# Patient Record
Sex: Female | Born: 2000
Health system: Southern US, Community
[De-identification: ages and names within clinical notes are randomized; demographics above are authoritative.]

## PROBLEM LIST (undated history)

## (undated) DIAGNOSIS — F419 Anxiety disorder, unspecified: Secondary | ICD-10-CM

## (undated) DIAGNOSIS — R51 Headache: Secondary | ICD-10-CM

## (undated) DIAGNOSIS — J45909 Unspecified asthma, uncomplicated: Secondary | ICD-10-CM

## (undated) HISTORY — DX: Unspecified asthma, uncomplicated: J45.909

## (undated) HISTORY — DX: Headache: R51

## (undated) HISTORY — DX: Anxiety disorder, unspecified: F41.9

---
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
## 2018-04-17 ENCOUNTER — Ambulatory Visit
Admission: RE | Admit: 2018-04-17 | Discharge: 2018-04-17 | Disposition: A | Discharge status: Home / Self Care | Payer: Managed Care, Other (non HMO) | Source: Ambulatory Visit | Attending: Internal Medicine | Admitting: Internal Medicine

## 2018-04-17 ENCOUNTER — Ambulatory Visit (INDEPENDENT_AMBULATORY_CARE_PROVIDER_SITE_OTHER): Payer: Managed Care, Other (non HMO) | Admitting: Internal Medicine

## 2018-04-17 ENCOUNTER — Telehealth: Payer: Self-pay

## 2018-04-17 ENCOUNTER — Other Ambulatory Visit: Payer: Self-pay | Admitting: Internal Medicine

## 2018-04-17 ENCOUNTER — Encounter: Payer: Self-pay | Admitting: Internal Medicine

## 2018-04-17 VITALS — BP 116/78 | HR 112 | Temp 98.5°F | Ht 63.5 in | Wt 130.1 lb

## 2018-04-17 DIAGNOSIS — R6884 Jaw pain: Secondary | ICD-10-CM

## 2018-04-17 DIAGNOSIS — R51 Headache: Secondary | ICD-10-CM

## 2018-04-17 DIAGNOSIS — R519 Headache, unspecified: Secondary | ICD-10-CM

## 2018-04-17 LAB — CBC WITH DIFFERENTIAL/PLATELET
BASOS PCT: 0.5 % (ref 0.0–3.0)
Basophils Absolute: 0.1 10*3/uL (ref 0.0–0.1)
EOS PCT: 0.1 % (ref 0.0–5.0)
Eosinophils Absolute: 0 10*3/uL (ref 0.0–0.7)
HCT: 39.4 % (ref 36.0–49.0)
Hemoglobin: 13.2 g/dL (ref 12.0–16.0)
Lymphocytes Relative: 22.8 % — ABNORMAL LOW (ref 24.0–48.0)
Lymphs Abs: 2.3 10*3/uL (ref 0.7–4.0)
MCHC: 33.6 g/dL (ref 31.0–37.0)
MCV: 92.1 fl (ref 78.0–98.0)
Monocytes Absolute: 0.6 10*3/uL (ref 0.1–1.0)
Monocytes Relative: 6.1 % (ref 3.0–12.0)
Neutro Abs: 7 10*3/uL (ref 1.4–7.7)
Neutrophils Relative %: 70.5 % (ref 43.0–71.0)
PLATELETS: 298 10*3/uL (ref 150.0–575.0)
RBC: 4.28 Mil/uL (ref 3.80–5.70)
RDW: 13.2 % (ref 11.4–15.5)
WBC: 10 10*3/uL (ref 4.5–13.5)

## 2018-04-17 LAB — COMPREHENSIVE METABOLIC PANEL
ALT: 11 U/L (ref 0–35)
AST: 13 U/L (ref 0–37)
Albumin: 4.5 g/dL (ref 3.5–5.2)
Alkaline Phosphatase: 43 U/L — ABNORMAL LOW (ref 47–119)
BUN: 9 mg/dL (ref 6–23)
CO2: 27 mEq/L (ref 19–32)
Calcium: 9.8 mg/dL (ref 8.4–10.5)
Chloride: 101 mEq/L (ref 96–112)
Creatinine, Ser: 1.1 mg/dL (ref 0.40–1.20)
GFR: 82.01 mL/min (ref >60.00)
Glucose, Bld: 88 mg/dL (ref 70–99)
POTASSIUM: 3.8 meq/L (ref 3.5–5.1)
Sodium: 138 mEq/L (ref 135–145)
Total Bilirubin: 0.5 mg/dL (ref 0.2–1.2)
Total Protein: 7.9 g/dL (ref 6.0–8.3)

## 2018-04-17 LAB — SEDIMENTATION RATE: Sed Rate: 18 mm/hr (ref 0–20)

## 2018-04-17 LAB — POCT URINE PREGNANCY: Preg Test, Ur: NEGATIVE

## 2018-04-17 MED ORDER — HYDROCODONE-ACETAMINOPHEN 10-325 MG PO TABS: 1.0000 | ORAL_TABLET | Freq: Four times a day (QID) | ORAL | 0 refills | Status: DC | PRN

## 2018-04-17 MED ORDER — METHYLPREDNISOLONE ACETATE 80 MG/ML IJ SUSP
80.0000 mg | Freq: Once | INTRAMUSCULAR | Status: AC
  Administered 2018-04-17: 80 mg via INTRAMUSCULAR

## 2018-04-17 MED ORDER — PREDNISONE 10 MG PO TABS: ORAL_TABLET | ORAL | 0 refills | Status: DC

## 2018-04-17 MED ORDER — ACYCLOVIR 400 MG PO TABS: 400.0000 mg | ORAL_TABLET | Freq: Every day | ORAL | 0 refills | Status: DC

## 2018-04-17 NOTE — Patient Instructions (Addendum)
[facility] after you get your labs done here  I have ordered x rays of your jaw  I am treating you with steroids and an antiviral in case this is  Vasculitis or an unusual case of shingles  Start the prednisone tomorrow and the acyclovir today.  vicodin as needed for pain   If the xrays suggest an abscess,  I will let you know and change your medicine to an antibiotic

## 2018-04-17 NOTE — Telephone Encounter (Signed)
Copied from CRM [phone]. Topic: General - Inquiry >> Apr 17, 2018  2:29 PM Windy KalataMichael, Taylor L, NT wrote: Reason for CRM:  Herbert SetaHeather is calling from [facility] and states the patient is there to get a Orthopantomogram but they do not do those there, she is wanting to know if Dr. Darrick Huntsmanullo wants a Mandible which is plain film or  CT maxillofacial. Please contact at [phone]

## 2018-04-17 NOTE — Progress Notes (Signed)
Subjective:  Patient ID: Lisa Mitchell, female    DOB: Dec 16, 2000  Age: 17 y.o. MRN: [id number]  CC: The primary encounter diagnosis was Left-sided headache. A diagnosis of Pain in upper jaw was also pertinent to this visit.  HPI Lisa Mitchell presents for urgent evaluation of  Severe left sided facial pain .  Patient is accompanied by her mother,  The painbegan  4 days ago,  after her Thanksgiving dinner, and started at the left TMJ.  It is described as severe,  Lancinating,  And radiates to the ear,  left temple  And the molars  on the left upper maxilla.  Denies fevers,  Nausea,  No recent URI  No trouble swallowing.  Not particularly aggravated by hot or cold foods,  But chewing makes the pain worse , as well as opening the mouth.  Tried using a heating pad,  Made it worse.  Even cold air makes the pain worse.   When the pain is present it hurts to open her left eye fully so she feels her vision is blurry on that side but she is usually squinting at that point.    Started taking prednisone  Tapering dose  60 mg on day 1,  Then 50 mg day 2 ,  But after the 39m on day 3  Pain started getting worse again so she stopped taking it.  (last dose yesterday. Then she took her sister's augmentin 2 doses yesterday,  No change .  Has not had dental evaluation oin several years,  Back teeth always hurt,  Gums hurt and bleed after brushing   Outpatient Medications Prior to Visit  Medication Sig Dispense Refill  . Norethin-Eth Estradiol-Fe [facility] 0.4-35 MG-MCG tablet Chew 1 tablet by mouth daily. 1 Package 11  . erythromycin ophthalmic ointment Place 1 application into the left eye 4 (four) times daily. (Patient not taking: Reported on 04/17/2018) 3.5 g 0  . esomeprazole (NEXIUM) 20 MG capsule Take 2 capsules (40 mg total) by mouth daily. (Patient not taking: Reported on 04/17/2018) 30 capsule 0  . ketoconazole (NIZORAL) 2 % cream Apply 1 application topically 2 (two) times daily. (Patient not taking:  Reported on 04/17/2018) 15 g 1   No facility-administered medications prior to visit.     Review of Systems;  Patient denies headache, fevers, malaise, unintentional weight loss, skin rash, eye pain, sinus congestion and sinus pain, sore throat, dysphagia,  hemoptysis , cough, dyspnea, wheezing, chest pain, palpitations, orthopnea, edema, abdominal pain, nausea, melena, diarrhea, constipation, flank pain, dysuria, hematuria, urinary  Frequency, nocturia, numbness, tingling, seizures,  Focal weakness, Loss of consciousness,  Tremor, insomnia, depression, anxiety, and suicidal ideation.      Objective:  BP 116/78 (BP Location: Right Arm, Patient Position: Sitting, Cuff Size: Normal)   Pulse (!) 112   Temp 98.5 F (36.9 C)   Ht 5' 3.5" (1.613 m)   Wt 130 lb 1.9 oz (59 kg)   SpO2 96%   BMI 22.69 kg/m   BP Readings from Last 3 Encounters:  04/17/18 116/78  06/01/16 (!) 100/64  10/20/15 (!) 138/81    Wt Readings from Last 3 Encounters:  04/17/18 130 lb 1.9 oz (59 kg) (56 %, Z= 0.14)*  06/01/16 142 lb (64.4 kg) (79 %, Z= 0.80)*  10/20/15 134 lb (60.8 kg) (71 %, Z= 0.57)*   * Growth percentiles are based on CDC (Girls, 2-20 Years) data.    General appearance: alert, tearful, and appears stated age.  appears to be in moderate pain  Ears: normal TM's and external ear canals both ears Throat: lips, mucosa, and tongue normal; teeth and gums normal.  Left upper molars feel pain when tapped with tongue depressor  Neck: no cervical or pre/postauricular adenopathy, no carotid bruit, supple, symmetrical, trachea midline and thyroid not enlarged, symmetric, no tenderness/mass/nodules Back: symmetric, no curvature. ROM normal. No CVA tenderness. Lungs: clear to auscultation bilaterally Heart: regular rate and rhythm, S1, S2 normal, no murmur, click, rub or gallop Abdomen: soft, non-tender; bowel sounds normal; no masses,  no organomegaly Pulses: 2+ and symmetric Skin: Skin color, texture,  turgor normal. No rashes or lesions Lymph nodes: Cervical, supraclavicular, and axillary nodes normal.  No results found for: HGBA1C  Lab Results  Component Value Date   CREATININE 0.96 11/11/2016   CREATININE 0.98 06/01/2016   CREATININE 0.87 07/25/2013    Lab Results  Component Value Date   WBC 4.8 11/11/2016   HGB 11.2 (L) 11/11/2016   HCT 33.2 (L) 11/11/2016   PLT 273.0 11/11/2016   GLUCOSE 100 (H) 11/11/2016   ALT 10 11/11/2016   AST 18 11/11/2016   NA 139 11/11/2016   K 3.8 11/11/2016   CL 104 11/11/2016   CREATININE 0.96 11/11/2016   BUN 11 11/11/2016   CO2 28 11/11/2016   TSH 0.91 06/01/2016   INR 1.1 (H) 11/11/2016    No results found.  Assessment & Plan:   Problem List Items Addressed This Visit    Pain in upper jaw    Etiology unclear.  DDX includes shingles,  Trigeminal neuralgia, tooth abscess vascultis, and TMJ. Checking ESR and CBC but  Unfortunately serologic  testing will be ambiguous for infection /vasculitis given patient's self treatment with prednisone x 3 days and augmentin x 1 day.  Orthopantograms ordered.  Empiric steroids and antivirals started; warned therapy may change depending on interpretation of results.       Relevant Orders   DG Orthopantogram    Other Visit Diagnoses    Left-sided headache    -  Primary   Relevant Medications   methylPREDNISolone acetate (DEPO-MEDROL) injection 80 mg   Other Relevant Orders   Sedimentation rate   CBC with Differential/Platelet   Comprehensive metabolic panel   POCT urine pregnancy (Completed)      I am having Jeanette B. Coluccio start on predniSONE and acyclovir. I am also having her maintain her esomeprazole, ketoconazole, erythromycin, Norethin-Eth Estradiol-Fe, and HYDROcodone-acetaminophen. We will continue to administer methylPREDNISolone acetate.  Meds ordered this encounter  Medications  . methylPREDNISolone acetate (DEPO-MEDROL) injection 80 mg  . predniSONE (DELTASONE) 10 MG tablet      Sig: 6 tablets daily for 3 days,   then reduce by 1 tablet daily until gone    Dispense:  33 tablet    Refill:  0  . acyclovir (ZOVIRAX) 400 MG tablet    Sig: Take 1 tablet (400 mg total) by mouth 5 (five) times daily.    Dispense:  35 tablet    Refill:  0  . DISCONTD: HYDROcodone-acetaminophen (NORCO) 10-325 MG tablet    Sig: Take 1 tablet by mouth every 6 (six) hours as needed.    Dispense:  30 tablet    Refill:  0  . HYDROcodone-acetaminophen (NORCO) 10-325 MG tablet    Sig: Take 1 tablet by mouth every 6 (six) hours as needed.    Dispense:  30 tablet    Refill:  0   A total of 25 minutes  of face to face time was spent with patient more than half of which was spent in counselling and coordination of care   Medications Discontinued During This Encounter  Medication Reason  . HYDROcodone-acetaminophen (NORCO) 10-325 MG tablet Reorder    Follow-up: No follow-ups on file.   Crecencio Mc, MD

## 2018-04-17 NOTE — Assessment & Plan Note (Signed)
Etiology unclear.  DDX includes shingles,  Trigeminal neuralgia, tooth abscess vascultis, and TMJ. Checking ESR and CBC but  Unfortunately serologic  testing will be ambiguous for infection /vasculitis given patient's self treatment with prednisone x 3 days and augmentin x 1 day.  Orthopantograms ordered.  Empiric steroids and antivirals started; warned therapy may change depending on interpretation of results.

## 2018-06-14 ENCOUNTER — Encounter: Payer: Self-pay | Admitting: Internal Medicine

## 2018-06-14 ENCOUNTER — Other Ambulatory Visit (HOSPITAL_COMMUNITY)
Admission: RE | Admit: 2018-06-14 | Discharge: 2018-06-14 | Disposition: A | Discharge status: Home / Self Care | Payer: Managed Care, Other (non HMO) | Source: Ambulatory Visit | Attending: Internal Medicine | Admitting: Internal Medicine

## 2018-06-14 ENCOUNTER — Ambulatory Visit (INDEPENDENT_AMBULATORY_CARE_PROVIDER_SITE_OTHER): Payer: Managed Care, Other (non HMO) | Admitting: Internal Medicine

## 2018-06-14 VITALS — BP 98/62 | HR 84 | Temp 98.4°F | Resp 16 | Ht 63.5 in | Wt 133.2 lb

## 2018-06-14 DIAGNOSIS — Z0000 Encounter for general adult medical examination without abnormal findings: Secondary | ICD-10-CM | POA: Diagnosis not present

## 2018-06-14 DIAGNOSIS — R634 Abnormal weight loss: Secondary | ICD-10-CM | POA: Diagnosis not present

## 2018-06-14 DIAGNOSIS — Z113 Encounter for screening for infections with a predominantly sexual mode of transmission: Secondary | ICD-10-CM

## 2018-06-14 DIAGNOSIS — Z30013 Encounter for initial prescription of injectable contraceptive: Secondary | ICD-10-CM | POA: Diagnosis not present

## 2018-06-14 DIAGNOSIS — R87612 Low grade squamous intraepithelial lesion on cytologic smear of cervix (LGSIL): Secondary | ICD-10-CM

## 2018-06-14 DIAGNOSIS — Z124 Encounter for screening for malignant neoplasm of cervix: Secondary | ICD-10-CM | POA: Diagnosis present

## 2018-06-14 DIAGNOSIS — Z23 Encounter for immunization: Secondary | ICD-10-CM

## 2018-06-14 DIAGNOSIS — N761 Subacute and chronic vaginitis: Secondary | ICD-10-CM | POA: Insufficient documentation

## 2018-06-14 DIAGNOSIS — N926 Irregular menstruation, unspecified: Secondary | ICD-10-CM

## 2018-06-14 DIAGNOSIS — Z3202 Encounter for pregnancy test, result negative: Secondary | ICD-10-CM

## 2018-06-14 DIAGNOSIS — R6884 Jaw pain: Secondary | ICD-10-CM

## 2018-06-14 LAB — LIPID PANEL
Cholesterol: 153 mg/dL (ref 0–200)
HDL: 59.8 mg/dL (ref >39.00)
LDL Cholesterol: 84 mg/dL (ref 0–99)
NONHDL: 93.08
Total CHOL/HDL Ratio: 3
Triglycerides: 47 mg/dL (ref 0.0–149.0)
VLDL: 9.4 mg/dL (ref 0.0–40.0)

## 2018-06-14 LAB — POCT URINE PREGNANCY: PREG TEST UR: NEGATIVE

## 2018-06-14 LAB — TSH: TSH: 0.6 u[IU]/mL (ref 0.40–5.00)

## 2018-06-14 MED ORDER — MEDROXYPROGESTERONE ACETATE 150 MG/ML IM SUSP: 150.0000 mg | Freq: Once | INTRAMUSCULAR | Status: DC

## 2018-06-14 MED ORDER — MEDROXYPROGESTERONE ACETATE 150 MG/ML IM SUSP
150.0000 mg | Freq: Once | INTRAMUSCULAR | Status: AC
  Administered 2018-06-14: 150 mg via INTRAMUSCULAR

## 2018-06-14 NOTE — Progress Notes (Signed)
Patient ID: Lisa Mitchell, female    DOB: 12-06-2000  Age: 18 y.o. MRN: [id number]  The patient is here for annual  examination and management of other chronic and acute problems.   The risk factors are reflected in the social history.  The roster of all physicians providing medical care to patient - is listed in the Snapshot section of the chart.  Activities of daily living:  The patient is 100% independent in all ADLs: dressing, toileting, feeding as well as independent mobility  Home safety : The patient has smoke detectors in the home. They wear seatbelts.  There are no firearms at home. There is no violence in the home.   She has been sexually active,  Although not currently.   Has used barrier protection and oral contraception.  Discussed screening for Hepatitis C (has tattoes),  STDs and  HIV. There is no   history of blood transfusion. They have no travel history to infectious disease endemic areas of the world.  The patient has seen their dentist in the last six month. They have seen their eye doctor in the last year.  She does not   have excessive sun exposure. Discussed the need for sun protection: hats, long sleeves and use of sunscreen if there is significant sun exposure.   Diet: the importance of a healthy diet is discussed.  Dhe does  have a healthy diet.  The benefits of regular aerobic exercise were discussed. She works out  4 times per week ,  60 minutes.   Depression screen: there are no signs or vegative symptoms of depression- irritability, change in appetite, anhedonia, sadness/tearfullness.  The following portions of the patient's history were reviewed and updated as appropriate: allergies, current medications, past family history, past medical history,  past surgical history, past social history  and problem list.  Visual acuity was not assessed per patient preference since she has regular follow up with her ophthalmologist. Hearing and body mass index were assessed  and reviewed.   During the course of the visit the patient was educated and counseled about appropriate screening and preventive services including : STD screening,  nutrition counseling,  and recommended immunizations.    CC: The primary encounter diagnosis was Cervical cancer screening. Diagnoses of Encounter for preventive health examination, Screen for STD (sexually transmitted disease), Weight loss, Encounter for initial prescription of injectable contraceptive, Negative pregnancy test, Need for immunization against influenza, Visit for preventive health examination, Pain in upper jaw, Irregular menstrual cycle, and Subacute vaginitis were also pertinent to this visit.  1) Need for birth control: she would like to resume some form of birth control.  2) weight loss: 9 lbs  Over the last 6 months. Happy with her weight.  Not trying to lose any more   History Lisa Mitchell has a past medical history of Asthma and Headache(784.0).   She has no past surgical history on file.   Her family history is not on file.She reports that she has never smoked. She has never used smokeless tobacco. She reports that she does not drink alcohol or use drugs.  Outpatient Medications Prior to Visit  Medication Sig Dispense Refill  . acyclovir (ZOVIRAX) 400 MG tablet Take 1 tablet (400 mg total) by mouth 5 (five) times daily. (Patient not taking: Reported on 06/14/2018) 35 tablet 0  . erythromycin ophthalmic ointment Place 1 application into the left eye 4 (four) times daily. (Patient not taking: Reported on 04/17/2018) 3.5 g 0  . esomeprazole (NEXIUM)  20 MG capsule Take 2 capsules (40 mg total) by mouth daily. (Patient not taking: Reported on 04/17/2018) 30 capsule 0  . HYDROcodone-acetaminophen (NORCO) 10-325 MG tablet Take 1 tablet by mouth every 6 (six) hours as needed. (Patient not taking: Reported on 06/14/2018) 30 tablet 0  . ketoconazole (NIZORAL) 2 % cream Apply 1 application topically 2 (two) times daily.  (Patient not taking: Reported on 04/17/2018) 15 g 1  . Norethin-Eth Estradiol-Fe [facility](FEMCON FE) 0.4-35 MG-MCG tablet Chew 1 tablet by mouth daily. (Patient not taking: Reported on 06/14/2018) 1 Package 11  . predniSONE (DELTASONE) 10 MG tablet 6 tablets daily for 3 days,   then reduce by 1 tablet daily until gone (Patient not taking: Reported on 06/14/2018) 33 tablet 0   No facility-administered medications prior to visit.     Review of Systems   Patient denies headache, fevers, malaise, unintentional weight loss, skin rash, eye pain, sinus congestion and sinus pain, sore throat, dysphagia,  hemoptysis , cough, dyspnea, wheezing, chest pain, palpitations, orthopnea, edema, abdominal pain, nausea, melena, diarrhea, constipation, flank pain, dysuria, hematuria, urinary  Frequency, nocturia, numbness, tingling, seizures,  Focal weakness, Loss of consciousness,  Tremor, insomnia, depression, anxiety, and suicidal ideation.      Objective:  BP 98/62 (BP Location: Left Arm, Patient Position: Sitting, Cuff Size: Normal)   Pulse 84   Temp 98.4 F (36.9 C) (Oral)   Resp 16   Ht 5' 3.5" (1.613 m)   Wt 133 lb 3.2 oz (60.4 kg)   SpO2 99%   BMI 23.23 kg/m   Physical Exam   General Appearance:    Alert, cooperative, no distress, appears stated age  Head:    Normocephalic, without obvious abnormality, atraumatic  Eyes:    PERRL, conjunctiva/corneas clear, EOM's intact, fundi    benign, both eyes  Ears:    Normal TM's and external ear canals, both ears  Nose:   Nares normal, septum midline, mucosa normal, no drainage    or sinus tenderness  Throat:   Lips, mucosa, and tongue normal; teeth and gums normal  Neck:   Supple, symmetrical, trachea midline, no adenopathy;    thyroid:  no enlargement/tenderness/nodules; no carotid   bruit or JVD  Back:     Symmetric, no curvature, ROM normal, no CVA tenderness  Lungs:     Clear to auscultation bilaterally, respirations unlabored  Chest Wall:    No  tenderness or deformity   Heart:    Regular rate and rhythm, S1 and S2 normal, no murmur, rub   or gallop  Breast Exam:    No tenderness, masses, or nipple abnormality  Abdomen:     Soft, non-tender, bowel sounds active all four quadrants,    no masses, no organomegaly  Genitalia:    Pelvic: cervix normal in appearance, external genitalia normal, no adnexal masses or tenderness, no cervical motion tenderness, rectovaginal septum normal, uterus normal size, shape, and consistency and vagina normal with copious amounts of non odourous curd like  discharge  Extremities:   Extremities normal, atraumatic, no cyanosis or edema  Pulses:   2+ and symmetric all extremities  Skin:   Skin color, texture, turgor normal, no rashes or lesions  Lymph nodes:   Cervical, supraclavicular, and axillary nodes normal  Neurologic:   CNII-XII intact, normal strength, sensation and reflexes    throughout      Assessment & Plan:   Problem List Items Addressed This Visit    Contraception management    Patient  was given a printed information handout about Dep Provera.  UPT was negative .  Injection was  given,  Return in 3 months       Relevant Medications   medroxyPROGESTERone (DEPO-PROVERA) injection 150 mg (Completed)   Other Relevant Orders   POCT urine pregnancy (Completed)   Irregular menstrual cycle    Thyroid and pregnancy screens normal today .  She did not tolerate oral contraception due to recurrent vaginitis symptoms following every cycle. Resume contraception with trial of  Depo Provera, first dose given today       Pain in upper jaw    secondary to abscessed tooth.  She has had the tooth pulled by her dentist       Vaginitis    Suggested by history and exam.  Testing of cervical discharge underway       Visit for preventive health examination    Annual wellness  exam was done as well as a comprehensive physical exam  .  During the course of the visit the patient was educated and counseled  about appropriate screening and preventive services and screenings were discussed  initiation of cervical and breast cancer screening was done today .  She is not sexually active currently ,  Although she has been in the past.  She has been screened for hyperlipoidemia  In the past 5 years   nutrition counseling, skin cancer screening has been done, along with review of the age appropriate recommended immunizations.  Printed recommendations for health maintenance screenings was given.        Other Visit Diagnoses    Cervical cancer screening    -  Primary   Relevant Orders   Cytology - PAP( [place])   Encounter for preventive health examination       Relevant Orders   Lipid panel (Completed)   Screen for STD (sexually transmitted disease)       Relevant Orders   HIV Antibody (routine testing w rflx) (Completed)   Hepatitis C antibody (Completed)   Weight loss       Relevant Orders   TSH (Completed)   Negative pregnancy test       Need for immunization against influenza       Relevant Orders   Flu Vaccine QUAD 36+ mos IM (Completed)      I have discontinued Levora B. Mitchell's esomeprazole, ketoconazole, erythromycin, Norethin-Eth Estradiol-Fe, predniSONE, acyclovir, and HYDROcodone-acetaminophen. We administered medroxyPROGESTERone.  Meds ordered this encounter  Medications  . DISCONTD: medroxyPROGESTERone (DEPO-PROVERA) injection 150 mg  . medroxyPROGESTERone (DEPO-PROVERA) injection 150 mg    Medications Discontinued During This Encounter  Medication Reason  . erythromycin ophthalmic ointment Completed Course  . esomeprazole (NEXIUM) 20 MG capsule Patient has not taken in last 30 days  . HYDROcodone-acetaminophen (NORCO) 10-325 MG tablet Patient has not taken in last 30 days  . ketoconazole (NIZORAL) 2 % cream Patient has not taken in last 30 days  . Norethin-Eth Estradiol-Fe [facility](FEMCON FE) 0.4-35 MG-MCG tablet Patient has not taken in last 30 days  . predniSONE (DELTASONE)  10 MG tablet Completed Course  . medroxyPROGESTERone (DEPO-PROVERA) injection 150 mg   . acyclovir (ZOVIRAX) 400 MG tablet     Follow-up: No follow-ups on file.   Sherlene Shamseresa L Dylen Mcelhannon, MD

## 2018-06-14 NOTE — Patient Instructions (Addendum)
You need between 60 and 90 grams of protein daily to build muscle  Supplement your one meal daily with protein shakes and protein snacks (eggs,  Cheese,  Etc )  Medroxyprogesterone injection [Contraceptive] What is this medicine? MEDROXYPROGESTERONE (me DROX ee proe JES te rone) contraceptive injections prevent pregnancy. They provide effective birth control for 3 months. Depo-subQ Provera 104 is also used for treating pain related to endometriosis. This medicine may be used for other purposes; ask your health care provider or pharmacist if you have questions. COMMON BRAND NAME(S): Depo-Provera, Depo-subQ Provera 104 What should I tell my health care provider before I take this medicine? They need to know if you have any of these conditions: -frequently drink alcohol -asthma -blood vessel disease or a history of a blood clot in the lungs or legs -bone disease such as osteoporosis -breast cancer -diabetes -eating disorder (anorexia nervosa or bulimia) -high blood pressure -HIV infection or AIDS -kidney disease -liver disease -mental depression -migraine -seizures (convulsions) -stroke -tobacco smoker -vaginal bleeding -an unusual or allergic reaction to medroxyprogesterone, other hormones, medicines, foods, dyes, or preservatives -pregnant or trying to get pregnant -breast-feeding How should I use this medicine? Depo-Provera Contraceptive injection is given into a muscle. Depo-subQ Provera 104 injection is given under the skin. These injections are given by a health care professional. You must not be pregnant before getting an injection. The injection is usually given during the first 5 days after the start of a menstrual period or 6 weeks after delivery of a baby. Talk to your pediatrician regarding the use of this medicine in children. Special care may be needed. These injections have been used in female children who have started having menstrual periods. Overdosage: If you think  you have taken too much of this medicine contact a poison control center or emergency room at once. NOTE: This medicine is only for you. Do not share this medicine with others. What if I miss a dose? Try not to miss a dose. You must get an injection once every 3 months to maintain birth control. If you cannot keep an appointment, call and reschedule it. If you wait longer than 13 weeks between Depo-Provera contraceptive injections or longer than 14 weeks between Depo-subQ Provera 104 injections, you could get pregnant. Use another method for birth control if you miss your appointment. You may also need a pregnancy test before receiving another injection. What may interact with this medicine? Do not take this medicine with any of the following medications: -bosentan This medicine may also interact with the following medications: -aminoglutethimide -antibiotics or medicines for infections, especially rifampin, rifabutin, rifapentine, and griseofulvin -aprepitant -barbiturate medicines such as phenobarbital or primidone -bexarotene -carbamazepine -medicines for seizures like ethotoin, felbamate, oxcarbazepine, phenytoin, topiramate -modafinil -St. John's wort This list may not describe all possible interactions. Give your health care provider a list of all the medicines, herbs, non-prescription drugs, or dietary supplements you use. Also tell them if you smoke, drink alcohol, or use illegal drugs. Some items may interact with your medicine. What should I watch for while using this medicine? This drug does not protect you against HIV infection (AIDS) or other sexually transmitted diseases. Use of this product may cause you to lose calcium from your bones. Loss of calcium may cause weak bones (osteoporosis). Only use this product for more than 2 years if other forms of birth control are not right for you. The longer you use this product for birth control the more likely you will be  at risk for weak  bones. Ask your health care professional how you can keep strong bones. You may have a change in bleeding pattern or irregular periods. Many females stop having periods while taking this drug. If you have received your injections on time, your chance of being pregnant is very low. If you think you may be pregnant, see your health care professional as soon as possible. Tell your health care professional if you want to get pregnant within the next year. The effect of this medicine may last a long time after you get your last injection. What side effects may I notice from receiving this medicine? Side effects that you should report to your doctor or health care professional as soon as possible: -allergic reactions like skin rash, itching or hives, swelling of the face, lips, or tongue -breast tenderness or discharge -breathing problems -changes in vision -depression -feeling faint or lightheaded, falls -fever -pain in the abdomen, chest, groin, or leg -problems with balance, talking, walking -unusually weak or tired -yellowing of the eyes or skin Side effects that usually do not require medical attention (report to your doctor or health care professional if they continue or are bothersome): -acne -fluid retention and swelling -headache -irregular periods, spotting, or absent periods -temporary pain, itching, or skin reaction at site where injected -weight gain This list may not describe all possible side effects. Call your doctor for medical advice about side effects. You may report side effects to FDA at 1-800-FDA-1088. Where should I keep my medicine? This does not apply. The injection will be given to you by a health care professional. NOTE: This sheet is a summary. It may not cover all possible information. If you have questions about this medicine, talk to your doctor, pharmacist, or health care provider.  2019 Elsevier/Gold Standard (2008-05-24 18:37:56)    Irritable Bowel  Syndrome, Adult  Irritable bowel syndrome (IBS) is a group of symptoms that affects the organs responsible for digestion (gastrointestinal or GI tract). IBS is not one specific disease. To regulate how the GI tract works, the body sends signals back and forth between the intestines and the brain. If you have IBS, there may be a problem with these signals. As a result, the GI tract does not function normally. The intestines may become more sensitive and overreact to certain things. This may be especially true when you eat certain foods or when you are under stress. There are four types of IBS. These may be determined based on the consistency of your stool (feces):  IBS with diarrhea.  IBS with constipation.  Mixed IBS.  Unsubtyped IBS. It is important to know which type of IBS you have. Certain treatments are more likely to be helpful for certain types of IBS. What are the causes? The exact cause of IBS is not known. What increases the risk? You may have a higher risk for IBS if you:  Are female.  Are younger than 3540.  Have a family history of IBS.  Have a mental health condition, such as depression, anxiety, or post-traumatic stress disorder.  Have had a bacterial infection of your GI tract. What are the signs or symptoms? Symptoms of IBS vary from person to person. The main symptom is abdominal pain or discomfort. Other symptoms usually include one or more of the following:  Diarrhea, constipation, or both.  Abdominal swelling or bloating.  Feeling full after eating a small or regular-sized meal.  Frequent gas.  Mucus in the stool.  A feeling  of having more stool left after a bowel movement. Symptoms tend to come and go. They may be triggered by stress, mental health conditions, or certain foods. How is this diagnosed? This condition may be diagnosed based on a physical exam, your medical history, and your symptoms. You may have tests, such as:  Blood tests.  Stool  test.  X-rays.  CT scan.  Colonoscopy. This is a procedure in which your GI tract is viewed with a long, thin, flexible tube. How is this treated? There is no cure for IBS, but treatment can help relieve symptoms. Treatment depends on the type of IBS you have, and may include:  Changes to your diet, such as: ? Avoiding foods that cause symptoms. ? Drinking more water. ? Following a low-FODMAP (fermentable oligosaccharides, disaccharides, monosaccharides, and polyols) diet for up to 6 weeks, or as told by your health care provider. FODMAPs are sugars that are hard for some people to digest. ? Eating more fiber. ? Eating medium-sized meals at the same times every day.  Medicines. These may include: ? Fiber supplements, if you have constipation. ? Medicine to control diarrhea (antidiarrheal medicines). ? Medicine to help control muscle tightening (spasms) in your GI tract (antispasmodic medicines). ? Medicines to help with mental health conditions, such as antidepressants or tranquilizers.  Talk therapy or counseling.  Working with a diet and nutrition specialist (dietitian) to help create a food plan that is right for you.  Managing your stress. Follow these instructions at home: Eating and drinking  Eat a healthy diet.  Eat medium-sized meals at about the same time every day. Do not eat large meals.  Gradually eat more fiber-rich foods. These include whole grains, fruits, and vegetables. This may be especially helpful if you have IBS with constipation.  Eat a diet low in FODMAPs.  Drink enough fluid to keep your urine pale yellow.  Keep a journal of foods that seem to trigger symptoms.  Avoid foods and drinks that: ? Contain added sugar. ? Make your symptoms worse. Dairy products, caffeinated drinks, and carbonated drinks can make symptoms worse for some people. General instructions  Take over-the-counter and prescription medicines and supplements only as told by your  health care provider.  Get enough exercise. Do at least 150 minutes of moderate-intensity exercise each week.  Manage your stress. Getting enough sleep and exercise can help you manage stress.  Keep all follow-up visits as told by your health care provider and therapist. This is important. Alcohol Use  Do not drink alcohol if: ? Your health care provider tells you not to drink. ? You are pregnant, may be pregnant, or are planning to become pregnant.  If you drink alcohol, limit how much you have: ? 0-1 drink a day for women. ? 0-2 drinks a day for men.  Be aware of how much alcohol is in your drink. In the [place] one drink equals one typical bottle of beer (12 oz), one-half glass of wine (5 oz), or one shot of hard liquor (1 oz). Contact a health care provider if you have:  Constant pain.  Weight loss.  Difficulty or pain when swallowing.  Diarrhea that gets worse. Get help right away if you have:  Severe abdominal pain.  Fever.  Diarrhea with symptoms of dehydration, such as dizziness or dry mouth.  Bright red blood in your stool.  Stool that is black and tarry.  Abdominal swelling.  Vomiting that does not stop.  Blood in your vomit. Summary  Irritable bowel syndrome (IBS) is not one specific disease. It is a group of symptoms that affects digestion.  Your intestines may become more sensitive and overreact to certain things. This may be especially true when you eat certain foods or when you are under stress.  There is no cure for IBS, but treatment can help relieve symptoms. This information is not intended to replace advice given to you by your health care provider. Make sure you discuss any questions you have with your health care provider. Document Released: 05/03/2005 Document Revised: 04/26/2017 Document Reviewed: 04/26/2017 Elsevier Interactive Patient Education  2019 [employer].

## 2018-06-15 LAB — HEPATITIS C ANTIBODY
Hepatitis C Ab: NONREACTIVE
SIGNAL TO CUT-OFF: 0.02 (ref <1.00)

## 2018-06-15 LAB — HIV ANTIBODY (ROUTINE TESTING W REFLEX): HIV 1&2 Ab, 4th Generation: NONREACTIVE

## 2018-06-17 ENCOUNTER — Encounter: Payer: Self-pay | Admitting: Internal Medicine

## 2018-06-17 DIAGNOSIS — N760 Acute vaginitis: Secondary | ICD-10-CM | POA: Insufficient documentation

## 2018-06-17 DIAGNOSIS — Z309 Encounter for contraceptive management, unspecified: Secondary | ICD-10-CM | POA: Insufficient documentation

## 2018-06-17 NOTE — Assessment & Plan Note (Signed)
Annual wellness  exam was done as well as a comprehensive physical exam  .  During the course of the visit the patient was educated and counseled about appropriate screening and preventive services and screenings were discussed  initiation of cervical and breast cancer screening was done today .  She is not sexually active currently ,  Although she has been in the past.  She has been screened for hyperlipoidemia  In the past 5 years   nutrition counseling, skin cancer screening has been done, along with review of the age appropriate recommended immunizations.  Printed recommendations for health maintenance screenings was given.

## 2018-06-17 NOTE — Assessment & Plan Note (Addendum)
Suggested by history and exam.  Testing of cervical discharge was negative for all infections except candida

## 2018-06-17 NOTE — Assessment & Plan Note (Signed)
secondary to abscessed tooth.  She has had the tooth pulled by her dentist

## 2018-06-17 NOTE — Assessment & Plan Note (Signed)
Patient was given a printed information handout about Dep Provera.  UPT was negative .  Injection was  given,  Return in 3 months

## 2018-06-17 NOTE — Assessment & Plan Note (Signed)
Thyroid and pregnancy screens normal today .  She did not tolerate oral contraception due to recurrent vaginitis symptoms following every cycle. Resume contraception with trial of  Depo Provera, first dose given today

## 2018-06-19 ENCOUNTER — Telehealth: Payer: Self-pay | Admitting: Internal Medicine

## 2018-06-19 LAB — CERVICOVAGINAL ANCILLARY ONLY: Herpes: NEGATIVE

## 2018-06-19 NOTE — Telephone Encounter (Signed)
Pt calling for labs  

## 2018-06-19 NOTE — Telephone Encounter (Signed)
Attempted to return call to patient. Left VM to return call to office. 

## 2018-06-20 DIAGNOSIS — R87612 Low grade squamous intraepithelial lesion on cytologic smear of cervix (LGSIL): Secondary | ICD-10-CM | POA: Insufficient documentation

## 2018-06-20 LAB — CYTOLOGY - PAP
Bacterial vaginitis: NEGATIVE
Candida vaginitis: POSITIVE — AB
Chlamydia: NEGATIVE
HPV (WINDOPATH): NOT DETECTED
Neisseria Gonorrhea: NEGATIVE
Trichomonas: NEGATIVE

## 2018-06-20 MED ORDER — FLUCONAZOLE 150 MG PO TABS: 150.0000 mg | ORAL_TABLET | Freq: Every day | ORAL | 0 refills | Status: DC

## 2018-06-20 NOTE — Addendum Note (Signed)
Addended by: Sherlene Shams on: 06/20/2018 10:39 PM   Modules accepted: Orders

## 2018-06-20 NOTE — Assessment & Plan Note (Signed)
Guidelines recommend repeating PAP smear in one year.

## 2018-09-04 ENCOUNTER — Ambulatory Visit: Payer: Managed Care, Other (non HMO) | Admitting: Internal Medicine

## 2018-09-05 ENCOUNTER — Other Ambulatory Visit: Payer: Self-pay

## 2018-09-05 ENCOUNTER — Ambulatory Visit (INDEPENDENT_AMBULATORY_CARE_PROVIDER_SITE_OTHER): Payer: Self-pay

## 2018-09-05 DIAGNOSIS — Z30013 Encounter for initial prescription of injectable contraceptive: Secondary | ICD-10-CM

## 2018-09-05 DIAGNOSIS — Z3042 Encounter for surveillance of injectable contraceptive: Secondary | ICD-10-CM

## 2018-09-05 MED ORDER — MEDROXYPROGESTERONE ACETATE 150 MG/ML IM SUSP
150.0000 mg | Freq: Once | INTRAMUSCULAR | Status: AC
  Administered 2018-09-05: 150 mg via INTRAMUSCULAR

## 2018-09-05 NOTE — Progress Notes (Signed)
Patient presented today for Depo injection. Administered IM left upper outer quadrant.  Patient tolerated well.

## 2019-02-01 ENCOUNTER — Telehealth: Payer: Self-pay | Admitting: Internal Medicine

## 2019-02-01 DIAGNOSIS — R070 Pain in throat: Secondary | ICD-10-CM | POA: Diagnosis not present

## 2019-02-01 DIAGNOSIS — Z20828 Contact with and (suspected) exposure to other viral communicable diseases: Secondary | ICD-10-CM

## 2019-02-01 DIAGNOSIS — Z20822 Contact with and (suspected) exposure to covid-19: Secondary | ICD-10-CM

## 2019-02-01 DIAGNOSIS — J0300 Acute streptococcal tonsillitis, unspecified: Secondary | ICD-10-CM | POA: Diagnosis not present

## 2019-02-01 NOTE — Telephone Encounter (Signed)
Verbal received from PCP to schedule and enter COVID testing.

## 2019-02-02 ENCOUNTER — Other Ambulatory Visit: Payer: Self-pay | Admitting: Internal Medicine

## 2019-02-02 DIAGNOSIS — J020 Streptococcal pharyngitis: Secondary | ICD-10-CM | POA: Insufficient documentation

## 2019-02-02 MED ORDER — AMOXICILLIN-POT CLAVULANATE 875-125 MG PO TABS: 1.0000 | ORAL_TABLET | Freq: Two times a day (BID) | ORAL | 0 refills | Status: DC

## 2019-02-14 ENCOUNTER — Telehealth: Payer: Self-pay | Admitting: Internal Medicine

## 2019-02-14 NOTE — Telephone Encounter (Signed)
Lm on vm to call office to schedule a physical with pap.

## 2019-02-15 ENCOUNTER — Telehealth: Payer: Self-pay | Admitting: Internal Medicine

## 2019-02-15 NOTE — Telephone Encounter (Signed)
Lm to call office to set up a in office visit to see Dr. Derrel Nip for menstrual irregularities. Per Dr. Derrel Nip patient can be scheduled at 4pm, in office. Please put in notes that the 4pm in office time is ok'd by [facility]. Thank you.

## 2019-02-16 NOTE — Telephone Encounter (Signed)
Patient returning call to get scheduled. Attempted office, no answer. Please advise.

## 2019-02-19 NOTE — Telephone Encounter (Signed)
Lm on patient's vm to call office to set up an appointment.

## 2019-02-26 ENCOUNTER — Other Ambulatory Visit: Payer: Self-pay

## 2019-02-26 ENCOUNTER — Ambulatory Visit (INDEPENDENT_AMBULATORY_CARE_PROVIDER_SITE_OTHER): Payer: 59 | Admitting: Internal Medicine

## 2019-02-26 ENCOUNTER — Encounter: Payer: Self-pay | Admitting: Internal Medicine

## 2019-02-26 VITALS — BP 120/66 | HR 67 | Temp 96.6°F | Resp 15 | Ht 63.5 in | Wt 136.2 lb

## 2019-02-26 DIAGNOSIS — N921 Excessive and frequent menstruation with irregular cycle: Secondary | ICD-10-CM | POA: Diagnosis not present

## 2019-02-26 DIAGNOSIS — N926 Irregular menstruation, unspecified: Secondary | ICD-10-CM

## 2019-02-26 LAB — POCT URINE PREGNANCY: Preg Test, Ur: NEGATIVE

## 2019-02-26 MED ORDER — CRYSELLE-28 0.3-30 MG-MCG PO TABS: 1.0000 | ORAL_TABLET | Freq: Every day | ORAL | 0 refills | Status: DC

## 2019-02-26 NOTE — Progress Notes (Signed)
Subjective:  Patient ID: Lisa Mitchell, female    DOB: April 26, 2001  Age: 18 y.o. MRN: [id number]  CC: The primary encounter diagnosis was Irregular menstrual cycle. A diagnosis of Menometrorrhagia was also pertinent to this visit.  HPI Lisa Mitchell presents for evaluation and management of irregular menstrual cycle. She is a healthy 18 yr old female who had menarche in 8th grade (age 65 to 27) .  Periods have been regularly occurring  Every 28 days with a 4 to 6 day bleed until several months ago when she changed contraceptive therapy from oral contraception to Depo Provera.  She had started using OCPS  at age 66 to regulate her periods,  But stopped then due to recurrent UTI;s   started Depo Provera in January 2020 at age 82 and since then has averaged only 5 days per month of non menstrual bleeding.    She has been taking daily cranberry tablets to reduce recurrent of UTI and has not had one since January.   She is currently menstruating ,  Has been having a menstrual bleed for the last 2 weeks    Outpatient Medications Prior to Visit  Medication Sig Dispense Refill  . amoxicillin-clavulanate (AUGMENTIN) 875-125 MG tablet Take 1 tablet by mouth 2 (two) times daily. (Patient not taking: Reported on 02/26/2019) 14 tablet 0  . fluconazole (DIFLUCAN) 150 MG tablet Take 1 tablet (150 mg total) by mouth daily. (Patient not taking: Reported on 02/26/2019) 2 tablet 0   No facility-administered medications prior to visit.     Review of Systems;  Patient denies headache, fevers, malaise, unintentional weight loss, skin rash, eye pain, sinus congestion and sinus pain, sore throat, dysphagia,  hemoptysis , cough, dyspnea, wheezing, chest pain, palpitations, orthopnea, edema, abdominal pain, nausea, melena, diarrhea, constipation, flank pain, dysuria, hematuria, urinary  Frequency, nocturia, numbness, tingling, seizures,  Focal weakness, Loss of consciousness,  Tremor, insomnia, depression, anxiety,  and suicidal ideation.      Objective:  BP 120/66 (BP Location: Left Arm, Patient Position: Sitting, Cuff Size: Normal)   Pulse 67   Temp (!) 96.6 F (35.9 C) (Temporal)   Resp 15   Ht 5' 3.5" (1.613 m)   Wt 136 lb 3.2 oz (61.8 kg)   SpO2 99%   BMI 23.75 kg/m   BP Readings from Last 3 Encounters:  02/26/19 120/66  06/14/18 98/62  04/17/18 116/78    Wt Readings from Last 3 Encounters:  02/26/19 136 lb 3.2 oz (61.8 kg)  06/14/18 133 lb 3.2 oz (60.4 kg) (60 %, Z= 0.26)*  04/17/18 130 lb 1.9 oz (59 kg) (56 %, Z= 0.14)*   * Growth percentiles are based on CDC (Girls, 2-20 Years) data.    General appearance: alert, cooperative and appears stated age Ears: normal TM's and external ear canals both ears Throat: lips, mucosa, and tongue normal; teeth and gums normal Neck: no adenopathy, no carotid bruit, supple, symmetrical, trachea midline and thyroid not enlarged, symmetric, no tenderness/mass/nodules Back: symmetric, no curvature. ROM normal. No CVA tenderness. Lungs: clear to auscultation bilaterally Heart: regular rate and rhythm, S1, S2 normal, no murmur, click, rub or gallop Abdomen: soft, non-tender; bowel sounds normal; no masses,  no organomegaly Pulses: 2+ and symmetric Skin: Skin color, texture, turgor normal. No rashes or lesions Lymph nodes: Cervical, supraclavicular, and axillary nodes normal.  No results found for: HGBA1C  Lab Results  Component Value Date   CREATININE 1.10 04/17/2018   CREATININE 0.96 11/11/2016  CREATININE 0.98 06/01/2016    Lab Results  Component Value Date   WBC 10.0 04/17/2018   HGB 13.2 04/17/2018   HCT 39.4 04/17/2018   PLT 298.0 04/17/2018   GLUCOSE 88 04/17/2018   CHOL 153 06/14/2018   TRIG 47.0 06/14/2018   HDL 59.80 06/14/2018   LDLCALC 84 06/14/2018   ALT 11 04/17/2018   AST 13 04/17/2018   NA 138 04/17/2018   K 3.8 04/17/2018   CL 101 04/17/2018   CREATININE 1.10 04/17/2018   BUN 9 04/17/2018   CO2 27 04/17/2018    TSH 0.60 06/14/2018   INR 1.1 (H) 11/11/2016    No results found.  Assessment & Plan:   Problem List Items Addressed This Visit      Unprioritized   Irregular menstrual cycle - Primary    Chronic problem, better controlled with OCPS than with Depo Prover.  pregnancy screens normal today .  She  Will resume oral contraception and continue cranberry tablets.       Relevant Orders   CBC with Differential/Platelet   Iron, TIBC and Ferritin Panel (Completed)   TSH   POCT urine pregnancy (Completed)   Menometrorrhagia    Chronic ,  Thyroid was normal in January. Resume OCPS for  Now, Iron and CBC pending            I have discontinued Genessis B. Milligan's fluconazole and amoxicillin-clavulanate. I am also having her maintain her Cryselle-28.  Meds ordered this encounter  Medications  . norgestrel-ethinyl estradiol (CRYSELLE-28) 0.3-30 MG-MCG tablet    Sig: Take 1 tablet by mouth daily.    Dispense:  28 tablet    Refill:  0    Medications Discontinued During This Encounter  Medication Reason  . amoxicillin-clavulanate (AUGMENTIN) 875-125 MG tablet Patient has not taken in last 30 days  . fluconazole (DIFLUCAN) 150 MG tablet Patient has not taken in last 30 days  A total of 25 minutes of face to face time was spent with patient more than half of which was spent in counselling about the above mentioned conditions  and coordination of care   Follow-up: No follow-ups on file.   Crecencio Mc, MD

## 2019-02-26 NOTE — Patient Instructions (Addendum)
Forget the Depo Provera!  Resume oral contraception starting on Sunday   If you are iron deficient based on today's labs, you can take an oral iron supplement for a  Month and then I will change your birth control to include the iron

## 2019-02-27 ENCOUNTER — Encounter: Payer: Self-pay | Admitting: Internal Medicine

## 2019-02-27 DIAGNOSIS — N921 Excessive and frequent menstruation with irregular cycle: Secondary | ICD-10-CM | POA: Insufficient documentation

## 2019-02-27 LAB — IRON,TIBC AND FERRITIN PANEL
%SAT: 22 % (calc) (ref 16–45)
Ferritin: 16 ng/mL (ref 16–154)
Iron: 82 ug/dL (ref 40–190)
TIBC: 367 mcg/dL (calc) (ref 250–450)

## 2019-02-27 LAB — CBC WITH DIFFERENTIAL/PLATELET
Basophils Absolute: 0.1 10*3/uL (ref 0.0–0.1)
Basophils Relative: 1.2 % (ref 0.0–3.0)
Eosinophils Absolute: 0.1 10*3/uL (ref 0.0–0.7)
Eosinophils Relative: 0.9 % (ref 0.0–5.0)
HCT: 39.3 % (ref 36.0–46.0)
Hemoglobin: 12.8 g/dL (ref 12.0–15.0)
Lymphocytes Relative: 28.5 % (ref 12.0–46.0)
Lymphs Abs: 2.1 10*3/uL (ref 0.7–4.0)
MCHC: 32.5 g/dL (ref 30.0–36.0)
MCV: 94.8 fl (ref 78.0–100.0)
Monocytes Absolute: 0.4 10*3/uL (ref 0.1–1.0)
Monocytes Relative: 6 % (ref 3.0–12.0)
Neutro Abs: 4.6 10*3/uL (ref 1.4–7.7)
Neutrophils Relative %: 63.4 % (ref 43.0–77.0)
Platelets: 265 10*3/uL (ref 150.0–400.0)
RBC: 4.15 Mil/uL (ref 3.87–5.11)
RDW: 14 % (ref 11.5–14.6)
WBC: 7.3 10*3/uL (ref 4.5–10.5)

## 2019-02-27 LAB — TSH: TSH: 0.46 u[IU]/mL (ref 0.35–5.50)

## 2019-02-27 NOTE — Assessment & Plan Note (Signed)
Chronic ,  Thyroid was normal in January. Resume OCPS for  Now, Iron and CBC pending

## 2019-02-27 NOTE — Assessment & Plan Note (Signed)
Chronic problem, better controlled with OCPS than with Depo Prover.  pregnancy screens normal today .  She  Will resume oral contraception and continue cranberry tablets.

## 2019-03-31 ENCOUNTER — Other Ambulatory Visit: Payer: Self-pay | Admitting: Internal Medicine

## 2019-04-29 ENCOUNTER — Other Ambulatory Visit: Payer: Self-pay | Admitting: Internal Medicine

## 2019-05-22 ENCOUNTER — Ambulatory Visit: Payer: 59 | Attending: Internal Medicine

## 2019-05-22 DIAGNOSIS — Z20822 Contact with and (suspected) exposure to covid-19: Secondary | ICD-10-CM | POA: Diagnosis not present

## 2019-05-24 LAB — NOVEL CORONAVIRUS, NAA: SARS-CoV-2, NAA: NOT DETECTED

## 2019-05-24 IMAGING — CR DG MANDIBLE 4+V
5 series · 5 of 5 positions shown · non-contrast
Comparison: None.

CLINICAL DATA: Left-sided jaw pain.

EXAM:
MANDIBLE - 4+ VIEW

[mandible [person_name]]
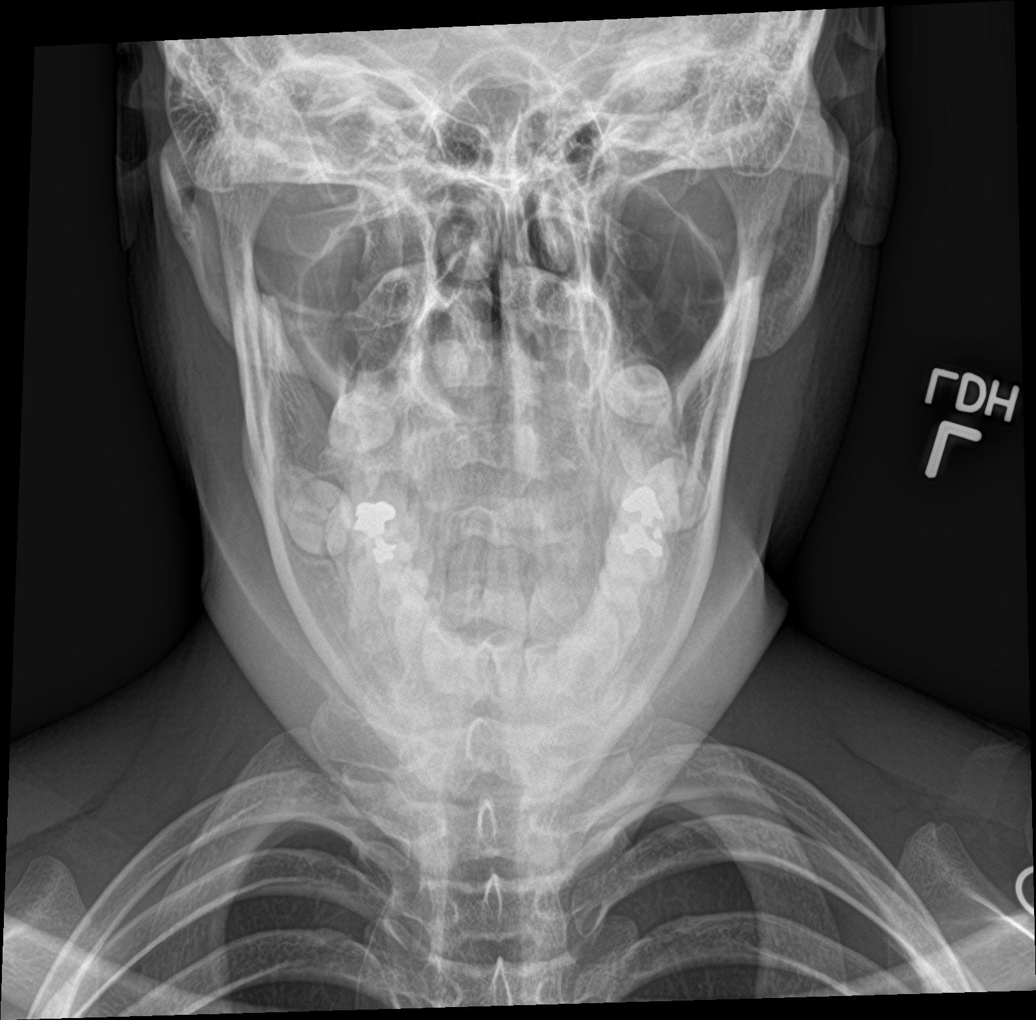

[mandible obl (1 of 3)]
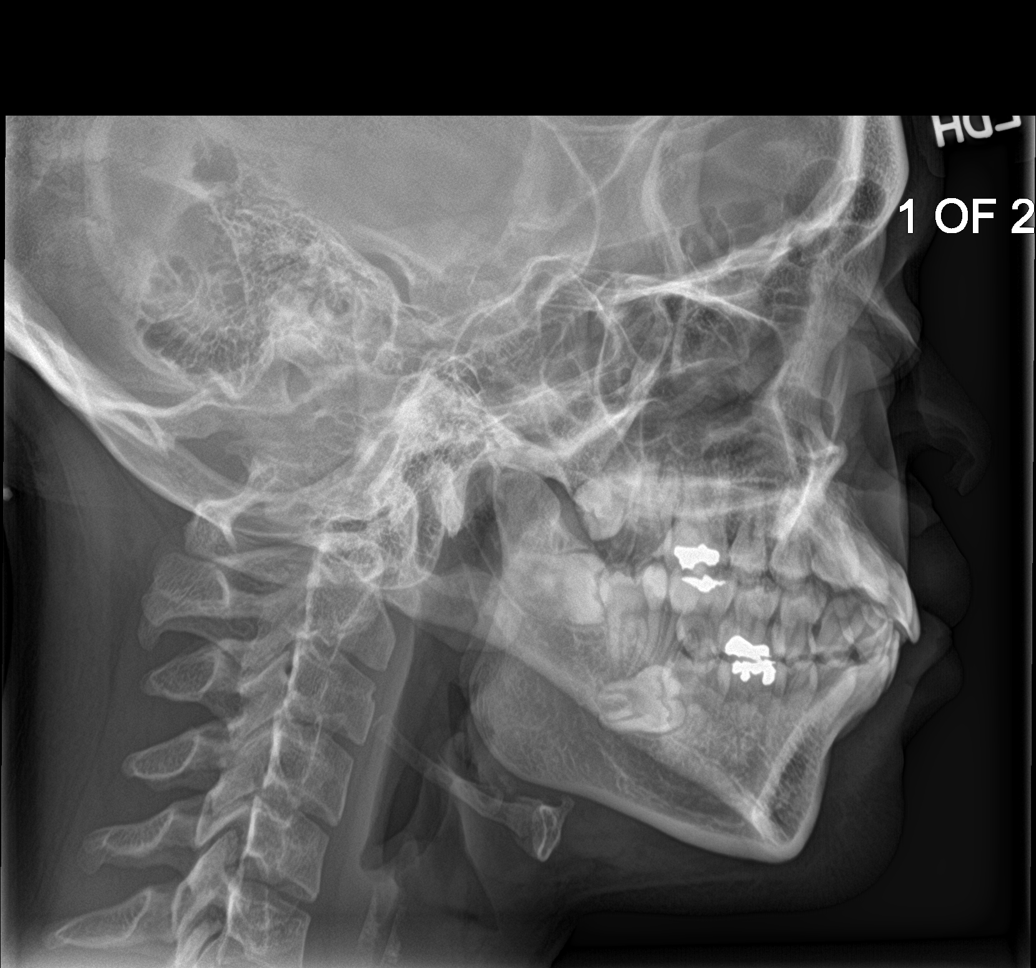

[mandible obl (2 of 3)]
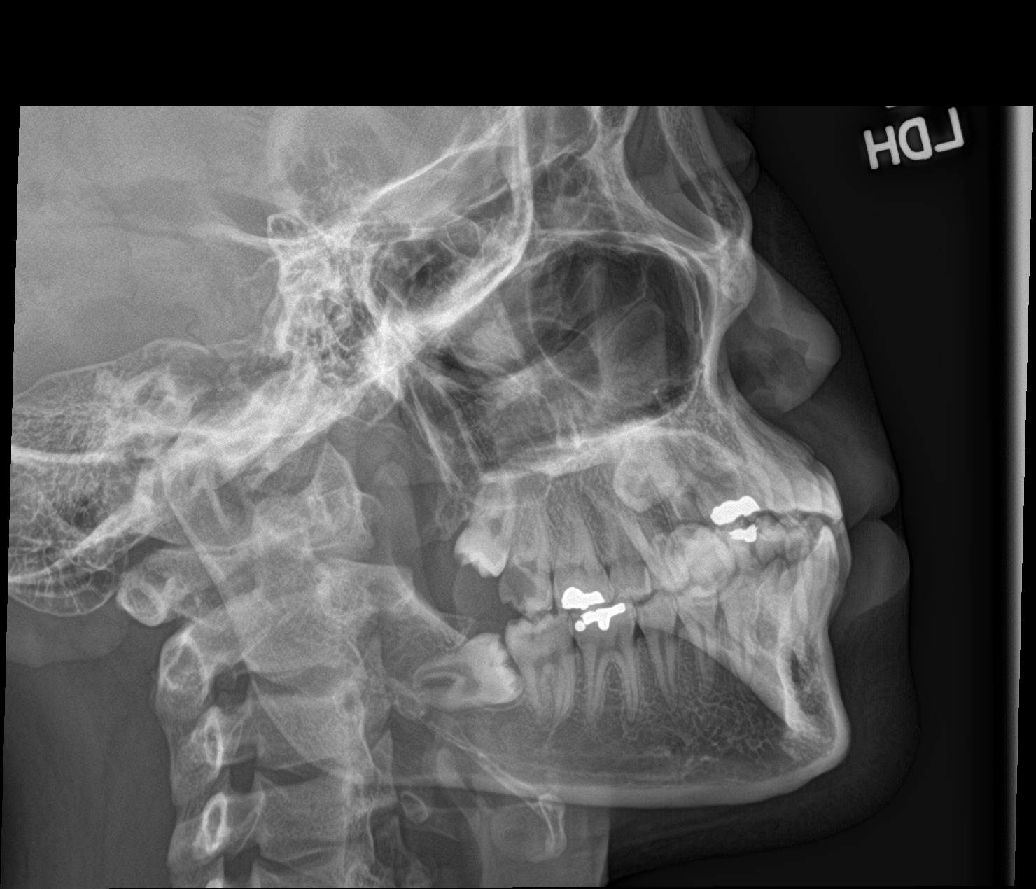

[mandible pa]
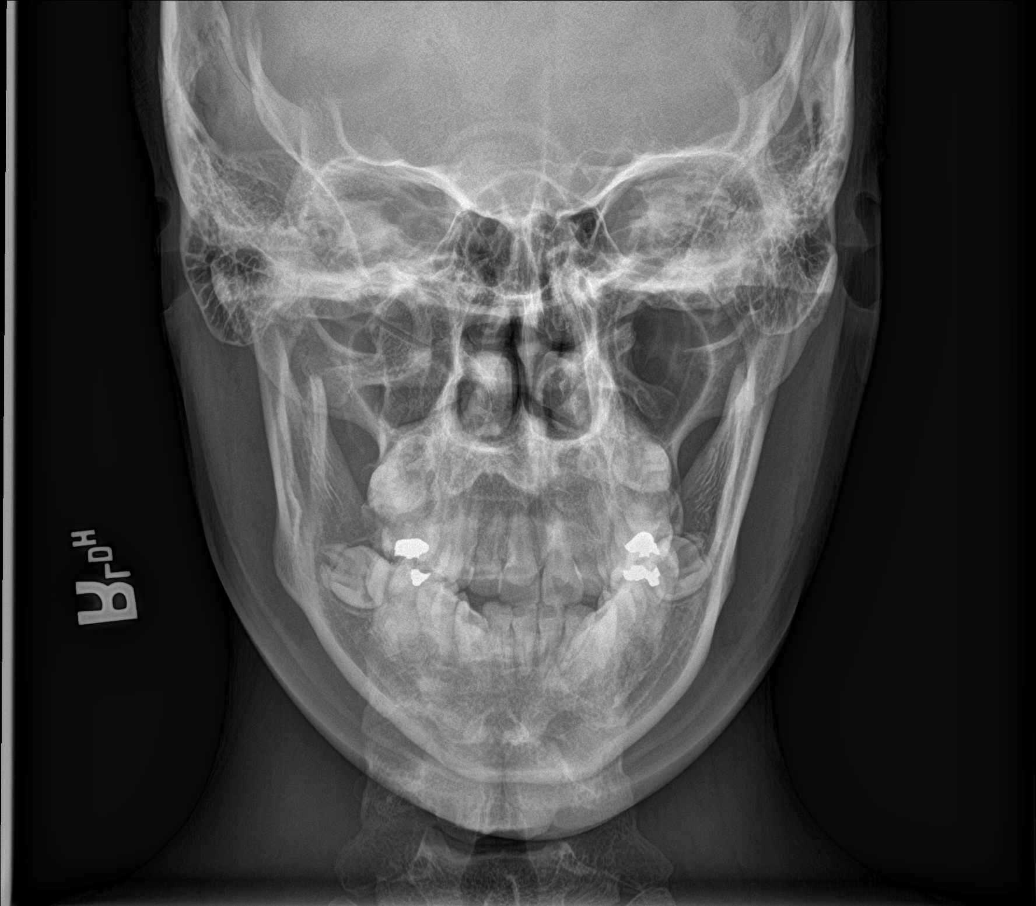

[mandible obl (3 of 3)]
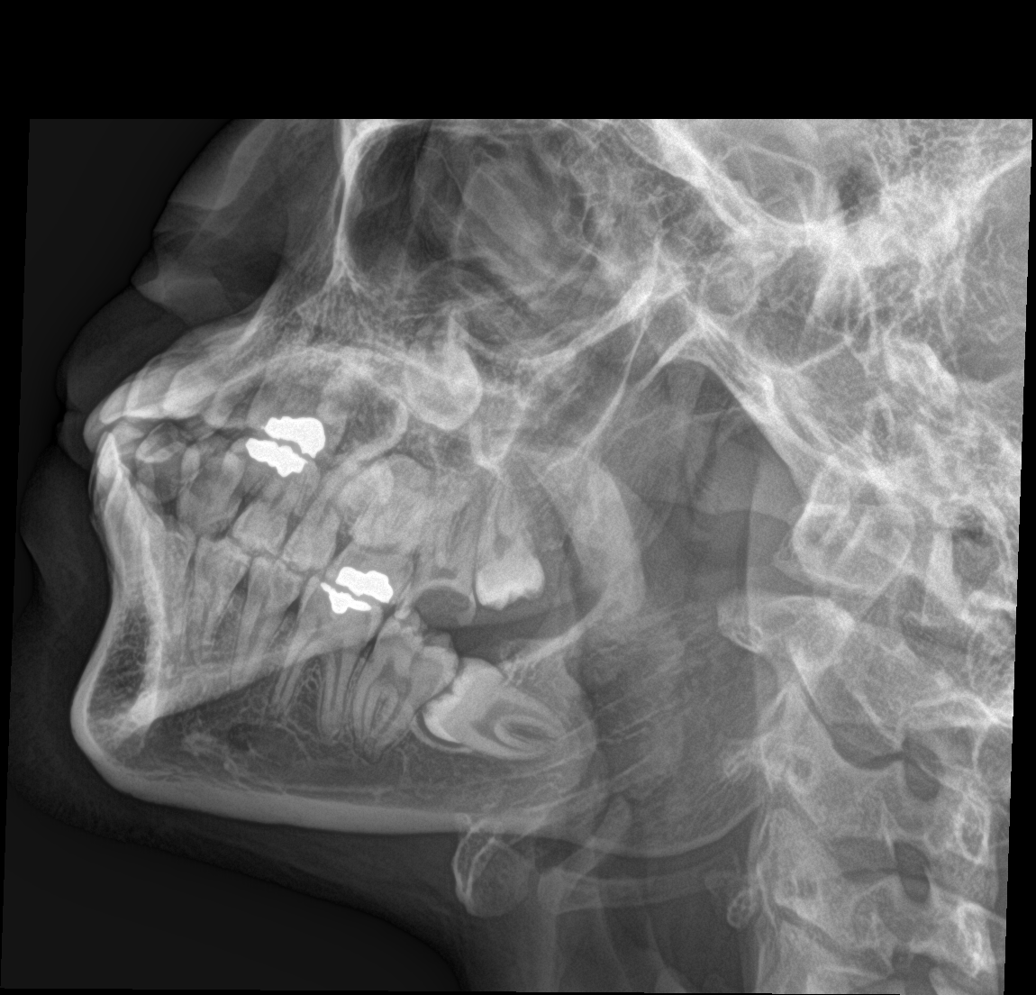

[5 of 5 positions shown; findings below may reference images not displayed]

FINDINGS: There is no evidence of fracture or other focal bone lesions.
IMPRESSION: Negative.

## 2019-05-29 ENCOUNTER — Other Ambulatory Visit: Payer: Self-pay | Admitting: Internal Medicine

## 2019-05-29 DIAGNOSIS — J020 Streptococcal pharyngitis: Secondary | ICD-10-CM

## 2019-05-29 MED ORDER — AMOXICILLIN-POT CLAVULANATE 875-125 MG PO TABS: 1.0000 | ORAL_TABLET | Freq: Two times a day (BID) | ORAL | 0 refills | Status: DC

## 2019-05-29 NOTE — Assessment & Plan Note (Signed)
Recurrent.  Treating with augmentin

## 2019-06-01 ENCOUNTER — Other Ambulatory Visit: Payer: Self-pay | Admitting: Internal Medicine

## 2019-06-01 DIAGNOSIS — J039 Acute tonsillitis, unspecified: Secondary | ICD-10-CM | POA: Diagnosis not present

## 2019-06-01 DIAGNOSIS — J029 Acute pharyngitis, unspecified: Secondary | ICD-10-CM | POA: Diagnosis not present

## 2019-06-04 ENCOUNTER — Other Ambulatory Visit: Payer: Self-pay | Admitting: Internal Medicine

## 2019-06-04 ENCOUNTER — Emergency Department: Payer: 59

## 2019-06-04 ENCOUNTER — Encounter: Payer: Self-pay | Admitting: Emergency Medicine

## 2019-06-04 ENCOUNTER — Emergency Department
Admission: EM | Admit: 2019-06-04 | Discharge: 2019-06-04 | Disposition: A | Discharge status: Home / Self Care | Payer: 59 | Attending: Emergency Medicine | Admitting: Emergency Medicine

## 2019-06-04 ENCOUNTER — Other Ambulatory Visit: Payer: Self-pay

## 2019-06-04 DIAGNOSIS — R1310 Dysphagia, unspecified: Secondary | ICD-10-CM | POA: Diagnosis not present

## 2019-06-04 DIAGNOSIS — J028 Acute pharyngitis due to other specified organisms: Secondary | ICD-10-CM | POA: Insufficient documentation

## 2019-06-04 DIAGNOSIS — R590 Localized enlarged lymph nodes: Secondary | ICD-10-CM | POA: Insufficient documentation

## 2019-06-04 DIAGNOSIS — J45909 Unspecified asthma, uncomplicated: Secondary | ICD-10-CM | POA: Diagnosis not present

## 2019-06-04 DIAGNOSIS — B9789 Other viral agents as the cause of diseases classified elsewhere: Secondary | ICD-10-CM | POA: Diagnosis not present

## 2019-06-04 DIAGNOSIS — J029 Acute pharyngitis, unspecified: Secondary | ICD-10-CM

## 2019-06-04 DIAGNOSIS — H65191 Other acute nonsuppurative otitis media, right ear: Secondary | ICD-10-CM

## 2019-06-04 LAB — GROUP A STREP BY PCR: Group A Strep by PCR: NOT DETECTED

## 2019-06-04 MED ORDER — METHYLPREDNISOLONE SODIUM SUCC 125 MG IJ SOLR
125.0000 mg | Freq: Once | INTRAMUSCULAR | Status: AC
  Administered 2019-06-04: 13:00:00 125 mg via INTRAMUSCULAR
  Filled 2019-06-04: qty 2

## 2019-06-04 NOTE — ED Notes (Addendum)
See triage note  Presents with sore throat for about 2-3 weeks  Afebrile on arrival  States she was placed on PCN  And then was seen again last week and placed on Augmentin    She was also given a steroid shot  States she feels like the swelling his worse  And moving into right ear

## 2019-06-04 NOTE — ED Provider Notes (Signed)
[facility] Emergency Department Provider Note   ____________________________________________   First MD Initiated Contact with Patient 06/04/19 1131     (approximate)  I have reviewed the triage vital signs and the nursing notes.   HISTORY  Chief Complaint Sore Throat    HPI Lisa Mitchell is a 19 y.o. female patient complain of sore throat secondary to swollen glands for 3 weeks.  Patient able to tolerate soft food and fluids with mild discomfort.  Patient denies fever associated complaint.  Patient denies other URI signs or symptoms.  Patient rates pain as 8/10.  Described the pain as "achy".  Patient state unable to fully open her mouth.  No palliative measure for complaint.      Past Medical History:  Diagnosis Date  . Asthma   . [id number](785.8)     Patient Active Problem List   Diagnosis Date Noted  . Menometrorrhagia 02/27/2019  . Streptococcal pharyngitis 02/02/2019  . 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 Contraception management 06/17/2018  . Vaginitis 06/17/2018  . Irregular menstrual cycle 01/04/2017  . Visit for preventive health examination 06/03/2016    History reviewed. No pertinent surgical history.  Prior to Admission medications   Medication Sig Start Date End Date Taking? Authorizing Provider  amoxicillin-clavulanate (AUGMENTIN) 875-125 MG tablet Take 1 tablet by mouth 2 (two) times daily. 05/29/19   Sherlene Shams, MD  CRYSELLE-28 0.3-30 MG-MCG tablet Take 1 tablet by mouth once daily 04/30/19   Sherlene Shams, MD    Allergies Patient has no known allergies.  No family history on file.  Social History Social History   Tobacco Use  . Smoking status: Never Smoker  . Smokeless tobacco: Never Used  Substance Use Topics  . Alcohol use: No  . Drug use: No    Review of Systems Constitutional: No fever/chills Eyes: No visual changes. ENT: Sore throat.   Cardiovascular: Denies chest pain. Respiratory:  Denies shortness of breath. Gastrointestinal: No abdominal pain.  No nausea, no vomiting.  No diarrhea.  No constipation. Genitourinary: Negative for dysuria. Musculoskeletal: Negative for back pain. Skin: Negative for rash. Neurological: Negative for headaches, focal weakness or numbness.   ____________________________________________   PHYSICAL EXAM:  VITAL SIGNS: ED Triage Vitals  Enc Vitals Group     BP 06/04/19 1145 138/68     Pulse Rate 06/04/19 1145 88     Resp 06/04/19 1145 14     Temp 06/04/19 1145 98.8 F (37.1 C)     Temp Source 06/04/19 1145 Oral     SpO2 06/04/19 1145 100 %     Weight 06/04/19 1122 127 lb (57.6 kg)     Height 06/04/19 1122 5\' 5"  (1.651 m)     Head Circumference --      Peak Flow --      Pain Score 06/04/19 1121 8     Pain Loc --      Pain Edu? --      Excl. in GC? --     Constitutional: Alert and oriented. Well appearing and in no acute distress. Eyes: Conjunctivae are normal. PERRL. EOMI. Head: Atraumatic. Nose: No congestion/rhinnorhea. Mouth/Throat: Mucous membranes are moist.  Oropharynx erythematous. Neck: No stridor.  Hematological/Lymphatic/Immunilogical: Right cervical lymphadenopathy. Cardiovascular: Normal rate, regular rhythm. Grossly normal heart sounds.  Good peripheral circulation. Respiratory: Normal respiratory effort.  No retractions. Lungs CTAB. Neurologic:  Normal speech and language. No gross focal neurologic deficits are appreciated. No gait instability. Skin:  Skin is warm, dry and intact. No rash noted. Psychiatric: Mood and affect are normal. Speech and behavior are normal.  ____________________________________________   LABS (all labs ordered are listed, but only abnormal results are displayed)  Labs Reviewed  GROUP A STREP BY PCR   ____________________________________________  EKG   ____________________________________________  RADIOLOGY  ED MD interpretation:    Official radiology report(s): DG  Neck Soft Tissue  Result Date: 06/04/2019 CLINICAL DATA:  Three weeks dysphagia EXAM: NECK SOFT TISSUES - 1+ VIEW COMPARISON:  None. FINDINGS: There is no evidence of retropharyngeal soft tissue swelling or epiglottic enlargement. The cervical airway is unremarkable and no radio-opaque foreign body identified. IMPRESSION: Negative. Electronically Signed   By: Franchot Gallo M.D.   On: 06/04/2019 12:39    ____________________________________________   PROCEDURES  Procedure(s) performed (including Critical Care):  Procedures   ____________________________________________   INITIAL IMPRESSION / ASSESSMENT AND PLAN / ED COURSE  As part of my medical decision making, I reviewed the following data within the [place]     Patient presents with sore throat and swollen glands for 3 weeks.  Patient was seen in urgent care clinic given viscous lidocaine which helps with the pain.  Discussed negative strep test and soft tissue neck x-ray results with patient.  Patient given discharge care instruction.  Patient advised continue previous medications start steroids tomorrow.    GENESE Mitchell was evaluated in Emergency Department on 06/04/2019 for the symptoms described in the history of present illness. She was evaluated in the context of the global COVID-19 pandemic, which necessitated consideration that the patient might be at risk for infection with the SARS-CoV-2 virus that causes COVID-19. Institutional protocols and algorithms that pertain to the evaluation of patients at risk for COVID-19 are in a state of rapid change based on information released by regulatory bodies including the CDC and federal and state organizations. These policies and algorithms were followed during the patient's care in the ED.       ____________________________________________   FINAL CLINICAL IMPRESSION(S) / ED DIAGNOSES  Final diagnoses:  Viral pharyngitis  Anterior cervical adenopathy      ED Discharge Orders    None       Note:  This document was prepared using Dragon voice recognition software and may include unintentional dictation errors.    Sable Feil, PA-C 06/04/19 1321    Earleen Newport, MD 06/04/19 1329

## 2019-06-04 NOTE — ED Triage Notes (Signed)
Pt reports glands swollen and sore throat for 3 weeks.

## 2019-06-04 NOTE — Discharge Instructions (Signed)
Continue previous medication and start Medrol Dosepak in the morning.

## 2019-06-05 ENCOUNTER — Other Ambulatory Visit: Payer: Self-pay | Admitting: Internal Medicine

## 2019-06-05 MED ORDER — PREDNISONE 10 MG PO TABS: ORAL_TABLET | ORAL | 0 refills | Status: DC

## 2019-06-13 ENCOUNTER — Other Ambulatory Visit: Payer: Self-pay

## 2019-06-13 ENCOUNTER — Telehealth: Payer: Self-pay | Admitting: Internal Medicine

## 2019-06-13 ENCOUNTER — Other Ambulatory Visit: Payer: Self-pay | Admitting: Internal Medicine

## 2019-06-13 ENCOUNTER — Emergency Department
Admission: EM | Admit: 2019-06-13 | Discharge: 2019-06-13 | Disposition: A | Discharge status: Home / Self Care | Payer: 59 | Attending: Emergency Medicine | Admitting: Emergency Medicine

## 2019-06-13 DIAGNOSIS — J45909 Unspecified asthma, uncomplicated: Secondary | ICD-10-CM | POA: Insufficient documentation

## 2019-06-13 DIAGNOSIS — J36 Peritonsillar abscess: Secondary | ICD-10-CM

## 2019-06-13 DIAGNOSIS — J029 Acute pharyngitis, unspecified: Secondary | ICD-10-CM

## 2019-06-13 DIAGNOSIS — J020 Streptococcal pharyngitis: Secondary | ICD-10-CM

## 2019-06-13 LAB — COMPREHENSIVE METABOLIC PANEL
ALT: 15 U/L (ref 0–44)
AST: 14 U/L — ABNORMAL LOW (ref 15–41)
Albumin: 3.8 g/dL (ref 3.5–5.0)
Alkaline Phosphatase: 47 U/L (ref 38–126)
Anion gap: 13 (ref 5–15)
BUN: 10 mg/dL (ref 6–20)
CO2: 24 mmol/L (ref 22–32)
Calcium: 9.4 mg/dL (ref 8.9–10.3)
Chloride: 100 mmol/L (ref 98–111)
Creatinine, Ser: 0.99 mg/dL (ref 0.44–1.00)
GFR calc Af Amer: >60 mL/min (ref >60)
GFR calc non Af Amer: >60 mL/min (ref >60)
Glucose, Bld: 88 mg/dL (ref 70–99)
Potassium: 3.7 mmol/L (ref 3.5–5.1)
Sodium: 137 mmol/L (ref 135–145)
Total Bilirubin: 0.7 mg/dL (ref 0.3–1.2)
Total Protein: 7.6 g/dL (ref 6.5–8.1)

## 2019-06-13 LAB — CBC WITH DIFFERENTIAL/PLATELET
Abs Immature Granulocytes: 0.14 10*3/uL — ABNORMAL HIGH (ref 0.00–0.07)
Basophils Absolute: 0 10*3/uL (ref 0.0–0.1)
Basophils Relative: 0 %
Eosinophils Absolute: 0 10*3/uL (ref 0.0–0.5)
Eosinophils Relative: 0 %
HCT: 39.5 % (ref 36.0–46.0)
Hemoglobin: 13.1 g/dL (ref 12.0–15.0)
Immature Granulocytes: 1 %
Lymphocytes Relative: 19 %
Lymphs Abs: 4.2 10*3/uL — ABNORMAL HIGH (ref 0.7–4.0)
MCH: 30.3 pg (ref 26.0–34.0)
MCHC: 33.2 g/dL (ref 30.0–36.0)
MCV: 91.4 fL (ref 80.0–100.0)
Monocytes Absolute: 1.5 10*3/uL — ABNORMAL HIGH (ref 0.1–1.0)
Monocytes Relative: 7 %
Neutro Abs: 16.3 10*3/uL — ABNORMAL HIGH (ref 1.7–7.7)
Neutrophils Relative %: 73 %
Platelets: 357 10*3/uL (ref 150–400)
RBC: 4.32 MIL/uL (ref 3.87–5.11)
RDW: 12.9 % (ref 11.5–15.5)
WBC: 22.3 10*3/uL — ABNORMAL HIGH (ref 4.0–10.5)
nRBC: 0 % (ref 0.0–0.2)

## 2019-06-13 LAB — MONONUCLEOSIS SCREEN: Mono Screen: NEGATIVE

## 2019-06-13 LAB — GROUP A STREP BY PCR: Group A Strep by PCR: DETECTED — AB

## 2019-06-13 MED ORDER — AMOXICILLIN-POT CLAVULANATE 875-125 MG PO TABS: 1.0000 | ORAL_TABLET | Freq: Two times a day (BID) | ORAL | 0 refills | Status: DC

## 2019-06-13 MED ORDER — PREDNISONE 10 MG PO TABS: ORAL_TABLET | ORAL | 0 refills | Status: DC

## 2019-06-13 MED ORDER — SODIUM CHLORIDE 0.9 % IV SOLN
3.0000 g | Freq: Once | INTRAVENOUS | Status: AC
  Administered 2019-06-13: 3 g via INTRAVENOUS
  Filled 2019-06-13: qty 8

## 2019-06-13 MED ORDER — DEXAMETHASONE SODIUM PHOSPHATE 10 MG/ML IJ SOLN
10.0000 mg | Freq: Once | INTRAMUSCULAR | Status: AC
  Administered 2019-06-13: 10 mg via INTRAMUSCULAR
  Filled 2019-06-13: qty 1

## 2019-06-13 MED ORDER — AMOXICILLIN-POT CLAVULANATE ER 1000-62.5 MG PO TB12: 2.0000 | ORAL_TABLET | Freq: Two times a day (BID) | ORAL | 0 refills | Status: DC

## 2019-06-13 NOTE — ED Triage Notes (Signed)
Pt c/o sore throat, states she was seen here last week for the same and given an abx injection and put on steroid taper, states she finished those and was feeling better until last night this morning the pain returned on the right side.

## 2019-06-13 NOTE — Assessment & Plan Note (Addendum)
Treated by Urgent care with clindamycin after symptoms progressed with empiric augmentin. Symptoms progressed and after 48 hours of clindamycin patinet was evaluated in ER on Jan 18  Epiglottitis was ruled out with films during ER visit. .  Culture for strep was negative. Patient was given IM Dexamethasone and told it was viral.  Treated with prednisone taper and symptoms returned less than one week after finishing the taper.

## 2019-06-13 NOTE — ED Notes (Signed)
See triage note. Pt states that after being seen here last week symptoms got better. Throat pale, swollen. Pt denies difficulty breathing.

## 2019-06-13 NOTE — ED Provider Notes (Signed)
[facility] Emergency Department Provider Note  ____________________________________________   First MD Initiated Contact with Patient 06/13/19 1233     (approximate)  I have reviewed the triage vital signs and the nursing notes.   HISTORY  Chief Complaint Sore Throat  HPI Lisa Mitchell is a 19 y.o. female presents to the ED with complaint of sore throat with the right side worse than the left.  Patient states that she saw her PCP the first of the month and was started on Augmentin.  She was seen in the emergency department on 06/04/2019 at which time her strep test was negative and she was given steroids IV.  Patient states she has not had any fever and began feeling better.  She states that in the last 1 to 2 days she has felt her throat getting more sore.  She continues to eat and drink as normal.  She continues to maintain saliva secretions without any difficulty.  She rates her pain as 7 out of 10.        Past Medical History:  Diagnosis Date  . Asthma   . [id number](836.6)     Patient Active Problem List   Diagnosis Date Noted  . Menometrorrhagia 02/27/2019  . Streptococcal pharyngitis 02/02/2019  . 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 Contraception management 06/17/2018  . Vaginitis 06/17/2018  . Irregular menstrual cycle 01/04/2017  . Visit for preventive health examination 06/03/2016    History reviewed. No pertinent surgical history.  Prior to Admission medications   Medication Sig Start Date End Date Taking? Authorizing Provider  amoxicillin-clavulanate (AUGMENTIN) 875-125 MG tablet Take 1 tablet by mouth 2 (two) times daily for 10 days. 06/13/19 06/23/19  Tommi Rumps, PA-C  CRYSELLE-28 0.3-30 MG-MCG tablet Take 1 tablet by mouth once daily 04/30/19   Sherlene Shams, MD  predniSONE (DELTASONE) 10 MG tablet Take 6 tablets x2 days, 5 tablets x 2 days, 4 tablets x 2 days, 3 tablets x 2 days, 2 tablets x 2 days, and then 1 tablet  each day for 2 days. 06/13/19   Tommi Rumps, PA-C    Allergies Patient has no known allergies.  No family history on file.  Social History Social History   Tobacco Use  . Smoking status: Never Smoker  . Smokeless tobacco: Never Used  Substance Use Topics  . Alcohol use: No  . Drug use: No    Review of Systems Constitutional: No fever/chills Eyes: No visual changes. ENT: Positive for sore throat. Cardiovascular: Denies chest pain. Respiratory: Denies shortness of breath. Gastrointestinal: No abdominal pain.  No nausea, no vomiting.  Musculoskeletal: Negative for muscle aches. Skin: Negative for rash. Neurological: Negative for headaches, focal weakness or numbness. ___________________________________________   PHYSICAL EXAM:  VITAL SIGNS: ED Triage Vitals  Enc Vitals Group     BP 06/13/19 1223 131/72     Pulse Rate 06/13/19 1223 (!) 103     Resp 06/13/19 1223 16     Temp 06/13/19 1223 98.2 F (36.8 C)     Temp Source 06/13/19 1223 Oral     SpO2 06/13/19 1223 99 %     Weight 06/13/19 1224 127 lb (57.6 kg)     Height 06/13/19 1224 5\' 5"  (1.651 m)     Head Circumference --      Peak Flow --      Pain Score 06/13/19 1224 7     Pain Loc --  Pain Edu? --      Excl. in GC? --     Constitutional: Alert and oriented. Well appearing and in no acute distress.  Patient is able to talk in complete sentences without any difficulty. Eyes: Conjunctivae are normal.  Head: Atraumatic. Nose: No congestion/rhinnorhea. Mouth/Throat: Mucous membranes are moist.  Oropharynx non-erythematous and without exudate.  Right tonsil is enlarged in comparison to the left however uvula is still midline. Patient is able to speak in complete sentences that he difficulty is having no no problems maintaining oral secretions. Neck: No stridor.   Hematological/Lymphatic/Immunilogical: Moderate tender cervical lymphadenopathy with right being greater than the left.. Cardiovascular:  Normal rate, regular rhythm. Grossly normal heart sounds.  Good peripheral circulation. Respiratory: Normal respiratory effort.  No retractions. Lungs CTAB. Musculoskeletal: Moves upper and lower extremities any difficulty normal gait was noted. Neurologic:  Normal speech and language. No gross focal neurologic deficits are appreciated.  Skin:  Skin is warm, dry and intact. No rash noted. Psychiatric: Mood and affect are normal. Speech and behavior are normal.  ____________________________________________   LABS (all labs ordered are listed, but only abnormal results are displayed)  Labs Reviewed  GROUP A STREP BY PCR - Abnormal; Notable for the following components:      Result Value   Group A Strep by PCR DETECTED (*)    All other components within normal limits  CBC WITH DIFFERENTIAL/PLATELET - Abnormal; Notable for the following components:   WBC 22.3 (*)    Neutro Abs 16.3 (*)    Lymphs Abs 4.2 (*)    Monocytes Absolute 1.5 (*)    Abs Immature Granulocytes 0.14 (*)    All other components within normal limits  COMPREHENSIVE METABOLIC PANEL - Abnormal; Notable for the following components:   AST 14 (*)    All other components within normal limits  MONONUCLEOSIS SCREEN    RADIOLOGY  ED MD interpretation:    Official radiology report(s): No results found.  ____________________________________________   PROCEDURES  Procedure(s) performed (including Critical Care):  Procedures   ____________________________________________   INITIAL IMPRESSION / ASSESSMENT AND PLAN / ED COURSE  As part of my medical decision making, I reviewed the following data within the electronic MEDICAL RECORD NUMBER Notes from prior ED visits and [place] Controlled Substance Database  19 year old female presents to the ED with complaint of sore throat.  Patient was seen earlier in the month by her PCP and placed on Augmentin.  She was also seen in the emergency department at which time she was given  steroids.  Patient states that she got better and just in the last 1 to 2 days has began having problems again with her throat.  All records indicate that she was negative for strep at that time.  Today patient is positive.  She continues to eat and drink as normal.  There is no deviation of her uvula and patient is maintaining secretions without any difficulty.  She was given Unasyn 3 g IV along with Decadron 10 mg and was improving at the time of discharge.  Patient is to continue with prescriptions for Augmentin and Decadron.  [facility] office is going to be calling the patient to schedule an appointment for follow-up with Dr. Willeen Cass.  Patient was made aware that if she has any difficulties to return to the emergency department before that appointment if needed. ____________________________________________   FINAL CLINICAL IMPRESSION(S) / ED DIAGNOSES  Final diagnoses:  Peritonsillar abscess  Strep pharyngitis  ED Discharge Orders         Ordered    amoxicillin-clavulanate (AUGMENTIN) 875-125 MG tablet  2 times daily     06/13/19 1517    predniSONE (DELTASONE) 10 MG tablet     06/13/19 1517           Note:  This document was prepared using Dragon voice recognition software and may include unintentional dictation errors.    Johnn Hai, PA-C 06/13/19 1535    Nance Pear, MD 06/13/19 1537

## 2019-06-13 NOTE — Discharge Instructions (Addendum)
Keep your appointment with Dr. Willeen Cass who is on-call for ENT.  Begin taking antibiotic today as prescribed twice a day until finished and prednisone beginning with 6 tablets today and tapering down by 1 tablet every 2 days.  Increase fluids to stay hydrated.  You may take Tylenol if needed for pain.  Dr. Talmage Nap office is calling you with an appointment time.  Please keep this appointment.

## 2019-06-13 NOTE — Telephone Encounter (Signed)
Spoke with patient and mother on multiple occasions today both during and after ER visit.  .  I am not happy with the  Current plan and the lack of imaging of patient's peritonsillar abscess . I have discussed patient's presentation with Erline Hau multiple times  today and updated him on the results of the ER visit.  He has recommended a STAT CT with IV contrast of neck as well as EBV titers and he will see her on Friday in the office .  Lisa Mitchell ask for kendall ,  Dr Mikey Bussing nurse. So that she will work Lisa Mitchell in on Friday   PATIENT NEEDS TO HAVE THE CT AND THE EBV TITER DONE ON Thursday

## 2019-06-14 ENCOUNTER — Other Ambulatory Visit: Payer: Self-pay | Admitting: Internal Medicine

## 2019-06-14 ENCOUNTER — Ambulatory Visit
Admission: RE | Admit: 2019-06-14 | Discharge: 2019-06-14 | Disposition: A | Discharge status: Home / Self Care | Payer: 59 | Source: Ambulatory Visit | Attending: Internal Medicine | Admitting: Internal Medicine

## 2019-06-14 ENCOUNTER — Telehealth: Payer: Self-pay

## 2019-06-14 DIAGNOSIS — J36 Peritonsillar abscess: Secondary | ICD-10-CM | POA: Diagnosis not present

## 2019-06-14 DIAGNOSIS — J029 Acute pharyngitis, unspecified: Secondary | ICD-10-CM

## 2019-06-14 MED ORDER — IOHEXOL 300 MG/ML  SOLN
75.0000 mL | Freq: Once | INTRAMUSCULAR | Status: AC | PRN
  Administered 2019-06-14: 75 mL via INTRAVENOUS

## 2019-06-14 NOTE — Telephone Encounter (Signed)
Radiology will call office with results.

## 2019-06-14 NOTE — Telephone Encounter (Signed)
Received a call report for stat CT scan. Pt was released to go home and was told that someone would give her a call back. Spoke with Dr. Lorin Picket verbally and she reviewed the CT scan. Dr. Lorin Picket advised that I call pt see how she is doing, let her know that there was nothing urgent seen on the scan. Spoke with pt and she stated that she is doing fine no symptoms since starting the medication from the ED. Pt also stated that she spoke with [facility] this morning and she has been scheduled with Dr. Jenne Campus tomorrow at 10am.

## 2019-06-14 NOTE — Telephone Encounter (Signed)
Ok, working on [place] now.

## 2019-06-14 NOTE — Telephone Encounter (Signed)
Thank you Lisa Mitchell!  One more thing: Will the patient be able to get the labs done at the facility where she is getting the CT ?  Otherwise we have to get her in the office for a lab visit TODAY

## 2019-06-14 NOTE — Telephone Encounter (Signed)
She is scheduled today and aware of her CT @ 10 am this morning. Called [facility], asked to speak to [facility] regarding urgent/stat appt for Dr Jenne Campus for Friday. Neysa Bonito stated that she could help me and when she pulled the patient up she said that Dr. Willeen Cass was on call yesterday and he told them to schedule her for Wednesday Feb. 3. She states that Sheneka is aware of appt. I stated that you had talked to Dr. Jenne Campus multiple times yesterday regarding her and was told to work her in with him on Friday. She stated that she will have to send a message to him regarding request.  Asiah is aware that I am working on getting her in with Dr. Jenne Campus tomorrow.

## 2019-06-15 DIAGNOSIS — J039 Acute tonsillitis, unspecified: Secondary | ICD-10-CM | POA: Diagnosis not present

## 2019-06-15 DIAGNOSIS — R070 Pain in throat: Secondary | ICD-10-CM | POA: Diagnosis not present

## 2019-06-15 NOTE — Telephone Encounter (Signed)
Lisa Mitchell called Lisa Mitchell and she was doing ok.  No swallowing problems, no difficulty breathing, able to swallow, etc.  Is taking medication prescribed.  Has appt scheduled with ENT 06/15/19

## 2019-06-18 ENCOUNTER — Encounter: Payer: Self-pay | Admitting: Internal Medicine

## 2019-06-20 ENCOUNTER — Telehealth: Payer: Self-pay

## 2019-06-20 NOTE — Telephone Encounter (Signed)
Disregard.  Not needed

## 2019-06-20 NOTE — Telephone Encounter (Signed)
Received a fax from [facility] stating that the Augmentin XR SR 12hr tablet is on back order.

## 2019-07-06 ENCOUNTER — Other Ambulatory Visit: Payer: Self-pay

## 2019-07-09 ENCOUNTER — Encounter: Payer: Self-pay | Admitting: Internal Medicine

## 2019-07-19 ENCOUNTER — Other Ambulatory Visit: Payer: Self-pay

## 2019-07-23 ENCOUNTER — Other Ambulatory Visit (HOSPITAL_COMMUNITY)
Admission: RE | Admit: 2019-07-23 | Discharge: 2019-07-23 | Disposition: A | Discharge status: Home / Self Care | Payer: 59 | Source: Ambulatory Visit | Attending: Internal Medicine | Admitting: Internal Medicine

## 2019-07-23 ENCOUNTER — Other Ambulatory Visit: Payer: Self-pay

## 2019-07-23 ENCOUNTER — Encounter: Payer: Self-pay | Admitting: Internal Medicine

## 2019-07-23 ENCOUNTER — Ambulatory Visit (INDEPENDENT_AMBULATORY_CARE_PROVIDER_SITE_OTHER): Payer: 59 | Admitting: Internal Medicine

## 2019-07-23 VITALS — BP 100/68 | HR 94 | Temp 97.7°F | Resp 14 | Ht 65.0 in | Wt 136.4 lb

## 2019-07-23 DIAGNOSIS — Z124 Encounter for screening for malignant neoplasm of cervix: Secondary | ICD-10-CM

## 2019-07-23 DIAGNOSIS — J038 Acute tonsillitis due to other specified organisms: Secondary | ICD-10-CM | POA: Diagnosis not present

## 2019-07-23 DIAGNOSIS — Z1151 Encounter for screening for human papillomavirus (HPV): Secondary | ICD-10-CM | POA: Diagnosis not present

## 2019-07-23 DIAGNOSIS — K580 Irritable bowel syndrome with diarrhea: Secondary | ICD-10-CM

## 2019-07-23 DIAGNOSIS — B9689 Other specified bacterial agents as the cause of diseases classified elsewhere: Secondary | ICD-10-CM | POA: Diagnosis not present

## 2019-07-23 DIAGNOSIS — Z0000 Encounter for general adult medical examination without abnormal findings: Secondary | ICD-10-CM | POA: Diagnosis not present

## 2019-07-23 DIAGNOSIS — G4700 Insomnia, unspecified: Secondary | ICD-10-CM | POA: Diagnosis not present

## 2019-07-23 MED ORDER — DICYCLOMINE HCL 10 MG PO CAPS: 10.0000 mg | ORAL_CAPSULE | Freq: Three times a day (TID) | ORAL | 0 refills | Status: DC

## 2019-07-23 MED ORDER — TRAZODONE HCL 50 MG PO TABS: 25.0000 mg | ORAL_TABLET | Freq: Every evening | ORAL | 3 refills | Status: DC | PRN

## 2019-07-23 NOTE — Patient Instructions (Signed)
For the sleep troubles:  Trial of trazodone;  Start with 1/2 tablet one hour before bedtime   For the stomach/bowel issues:  I am starting you on a medication for Irritable Bowel syndrome (IBS)  Take the medication (dicyclomine) 30 minutes before meals  And at bedtime if needed (but start the trazodone first by a week )   Irritable Bowel Syndrome, Adult  Irritable bowel syndrome (IBS) is a group of symptoms that affects the organs responsible for digestion (gastrointestinal or GI tract). IBS is not one specific disease. To regulate how the GI tract works, the body sends signals back and forth between the intestines and the brain. If you have IBS, there may be a problem with these signals. As a result, the GI tract does not function normally. The intestines may become more sensitive and overreact to certain things. This may be especially true when you eat certain foods or when you are under stress. There are four types of IBS. These may be determined based on the consistency of your stool (feces):  IBS with diarrhea.  IBS with constipation.  Mixed IBS.  Unsubtyped IBS. It is important to know which type of IBS you have. Certain treatments are more likely to be helpful for certain types of IBS. What are the causes? The exact cause of IBS is not known. What increases the risk? You may have a higher risk for IBS if you:  Are female.  Are younger than 71.  Have a family history of IBS.  Have a mental health condition, such as depression, anxiety, or post-traumatic stress disorder.  Have had a bacterial infection of your GI tract. What are the signs or symptoms? Symptoms of IBS vary from person to person. The main symptom is abdominal pain or discomfort. Other symptoms usually include one or more of the following:  Diarrhea, constipation, or both.  Abdominal swelling or bloating.  Feeling full after eating a small or regular-sized meal.  Frequent gas.  Mucus in the  stool.  A feeling of having more stool left after a bowel movement. Symptoms tend to come and go. They may be triggered by stress, mental health conditions, or certain foods. How is this diagnosed? This condition may be diagnosed based on a physical exam, your medical history, and your symptoms. You may have tests, such as:  Blood tests.  Stool test.  X-rays.  CT scan.  Colonoscopy. This is a procedure in which your GI tract is viewed with a long, thin, flexible tube. How is this treated? There is no cure for IBS, but treatment can help relieve symptoms. Treatment depends on the type of IBS you have, and may include:  Changes to your diet, such as: ? Avoiding foods that cause symptoms. ? Drinking more water. ? Following a low-FODMAP (fermentable oligosaccharides, disaccharides, monosaccharides, and polyols) diet for up to 6 weeks, or as told by your health care provider. FODMAPs are sugars that are hard for some people to digest. ? Eating more fiber. ? Eating medium-sized meals at the same times every day.  Medicines. These may include: ? Fiber supplements, if you have constipation. ? Medicine to control diarrhea (antidiarrheal medicines). ? Medicine to help control muscle tightening (spasms) in your GI tract (antispasmodic medicines). ? Medicines to help with mental health conditions, such as antidepressants or tranquilizers.  Talk therapy or counseling.  Working with a diet and nutrition specialist (dietitian) to help create a food plan that is right for you.  Managing your stress. Follow  these instructions at home: Eating and drinking  Eat a healthy diet.  Eat medium-sized meals at about the same time every day. Do not eat large meals.  Gradually eat more fiber-rich foods. These include whole grains, fruits, and vegetables. This may be especially helpful if you have IBS with constipation.  Eat a diet low in FODMAPs.  Drink enough fluid to keep your urine pale  yellow.  Keep a journal of foods that seem to trigger symptoms.  Avoid foods and drinks that: ? Contain added sugar. ? Make your symptoms worse. Dairy products, caffeinated drinks, and carbonated drinks can make symptoms worse for some people. General instructions  Take over-the-counter and prescription medicines and supplements only as told by your health care provider.  Get enough exercise. Do at least 150 minutes of moderate-intensity exercise each week.  Manage your stress. Getting enough sleep and exercise can help you manage stress.  Keep all follow-up visits as told by your health care provider and therapist. This is important. Alcohol Use  Do not drink alcohol if: ? Your health care provider tells you not to drink. ? You are pregnant, may be pregnant, or are planning to become pregnant.  If you drink alcohol, limit how much you have: ? 0-1 drink a day for women. ? 0-2 drinks a day for men.  Be aware of how much alcohol is in your drink. In the [place] one drink equals one typical bottle of beer (12 oz), one-half glass of wine (5 oz), or one shot of hard liquor (1 oz). Contact a health care provider if you have:  Constant pain.  Weight loss.  Difficulty or pain when swallowing.  Diarrhea that gets worse. Get help right away if you have:  Severe abdominal pain.  Fever.  Diarrhea with symptoms of dehydration, such as dizziness or dry mouth.  Bright red blood in your stool.  Stool that is black and tarry.  Abdominal swelling.  Vomiting that does not stop.  Blood in your vomit. Summary  Irritable bowel syndrome (IBS) is not one specific disease. It is a group of symptoms that affects digestion.  Your intestines may become more sensitive and overreact to certain things. This may be especially true when you eat certain foods or when you are under stress.  There is no cure for IBS, but treatment can help relieve symptoms. This information is not intended to  replace advice given to you by your health care provider. Make sure you discuss any questions you have with your health care provider. Document Revised: 04/26/2017 Document Reviewed: 04/26/2017 Elsevier Patient Education  2020 [employer].
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
## 2019-07-24 DIAGNOSIS — J038 Acute tonsillitis due to other specified organisms: Secondary | ICD-10-CM | POA: Insufficient documentation

## 2019-07-24 DIAGNOSIS — G4700 Insomnia, unspecified: Secondary | ICD-10-CM | POA: Insufficient documentation

## 2019-07-24 DIAGNOSIS — B9689 Other specified bacterial agents as the cause of diseases classified elsewhere: Secondary | ICD-10-CM | POA: Insufficient documentation

## 2019-07-24 DIAGNOSIS — K589 Irritable bowel syndrome without diarrhea: Secondary | ICD-10-CM | POA: Insufficient documentation

## 2019-07-24 NOTE — Assessment & Plan Note (Signed)
May be multifactorial including anxiety and physiologic etiologies (IBS).   Early waking noted . Trial of trazodone.

## 2019-07-24 NOTE — Assessment & Plan Note (Signed)
Occurred in January,  CT ruled out abscess reported by ER physician , ENT treated with prolonged abx and steroid taper.. now resolved.
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
## 2019-07-24 NOTE — Assessment & Plan Note (Signed)

## 2019-07-25 LAB — CYTOLOGY - PAP
Chlamydia: NEGATIVE
Comment: NEGATIVE
Comment: NEGATIVE
Comment: NEGATIVE
Comment: NORMAL
Diagnosis: NEGATIVE
High risk HPV: NEGATIVE
Neisseria Gonorrhea: NEGATIVE
Trichomonas: NEGATIVE

## 2019-08-02 DIAGNOSIS — Z03818 Encounter for observation for suspected exposure to other biological agents ruled out: Secondary | ICD-10-CM | POA: Diagnosis not present

## 2019-08-02 DIAGNOSIS — Z1152 Encounter for screening for COVID-19: Secondary | ICD-10-CM | POA: Diagnosis not present

## 2019-08-02 DIAGNOSIS — J039 Acute tonsillitis, unspecified: Secondary | ICD-10-CM | POA: Diagnosis not present

## 2019-09-11 ENCOUNTER — Other Ambulatory Visit: Payer: Self-pay | Admitting: Internal Medicine

## 2019-09-11 DIAGNOSIS — N760 Acute vaginitis: Secondary | ICD-10-CM

## 2019-09-11 MED ORDER — FLUCONAZOLE 150 MG PO TABS: 150.0000 mg | ORAL_TABLET | Freq: Every day | ORAL | 0 refills | Status: DC

## 2019-09-11 MED ORDER — METRONIDAZOLE 500 MG PO TABS: 500.0000 mg | ORAL_TABLET | Freq: Two times a day (BID) | ORAL | 0 refills | Status: DC

## 2019-09-11 NOTE — Assessment & Plan Note (Signed)
Empirically Treating with fluconazole and metronidazole .  If discarge persists,  Pelvic exam will be needed

## 2019-10-17 ENCOUNTER — Other Ambulatory Visit: Payer: Self-pay

## 2019-10-17 ENCOUNTER — Other Ambulatory Visit: Payer: Self-pay | Admitting: Internal Medicine

## 2019-10-17 MED ORDER — CRYSELLE-28 0.3-30 MG-MCG PO TABS: 1.0000 | ORAL_TABLET | Freq: Every day | ORAL | 5 refills | Status: DC

## 2019-12-24 DIAGNOSIS — Z1152 Encounter for screening for COVID-19: Secondary | ICD-10-CM | POA: Diagnosis not present

## 2019-12-24 DIAGNOSIS — Z03818 Encounter for observation for suspected exposure to other biological agents ruled out: Secondary | ICD-10-CM | POA: Diagnosis not present

## 2019-12-26 ENCOUNTER — Other Ambulatory Visit: Payer: Self-pay | Admitting: Internal Medicine

## 2019-12-26 DIAGNOSIS — U071 COVID-19: Secondary | ICD-10-CM

## 2020-01-02 DIAGNOSIS — Z20822 Contact with and (suspected) exposure to covid-19: Secondary | ICD-10-CM | POA: Diagnosis not present

## 2020-01-02 DIAGNOSIS — Z03818 Encounter for observation for suspected exposure to other biological agents ruled out: Secondary | ICD-10-CM | POA: Diagnosis not present

## 2020-01-07 ENCOUNTER — Telehealth: Payer: Self-pay | Admitting: Internal Medicine

## 2020-01-07 ENCOUNTER — Other Ambulatory Visit: Payer: Self-pay | Admitting: Internal Medicine

## 2020-01-07 DIAGNOSIS — T7421XS Adult sexual abuse, confirmed, sequela: Secondary | ICD-10-CM

## 2020-01-07 NOTE — Telephone Encounter (Signed)
lft msg for counselor Philis Nettle to call me

## 2020-01-16 ENCOUNTER — Telehealth: Payer: Self-pay | Admitting: Internal Medicine

## 2020-01-16 NOTE — Telephone Encounter (Signed)
lft vm for pt to call ofc to confirm ins.

## 2020-01-16 NOTE — Telephone Encounter (Signed)
Please verify pt ins and add info if available. Thank you!

## 2020-01-29 DIAGNOSIS — Z1152 Encounter for screening for COVID-19: Secondary | ICD-10-CM | POA: Diagnosis not present

## 2020-01-29 DIAGNOSIS — Z03818 Encounter for observation for suspected exposure to other biological agents ruled out: Secondary | ICD-10-CM | POA: Diagnosis not present

## 2020-03-05 ENCOUNTER — Other Ambulatory Visit: Payer: Self-pay

## 2020-03-05 ENCOUNTER — Encounter: Payer: Self-pay | Admitting: Internal Medicine

## 2020-03-05 ENCOUNTER — Ambulatory Visit (INDEPENDENT_AMBULATORY_CARE_PROVIDER_SITE_OTHER): Payer: 59 | Admitting: Internal Medicine

## 2020-03-05 VITALS — BP 130/80 | HR 98 | Temp 98.2°F | Ht 65.0 in | Wt 151.0 lb

## 2020-03-05 DIAGNOSIS — N921 Excessive and frequent menstruation with irregular cycle: Secondary | ICD-10-CM

## 2020-03-05 DIAGNOSIS — A630 Anogenital (venereal) warts: Secondary | ICD-10-CM

## 2020-03-05 DIAGNOSIS — Z113 Encounter for screening for infections with a predominantly sexual mode of transmission: Secondary | ICD-10-CM

## 2020-03-05 DIAGNOSIS — R69 Illness, unspecified: Secondary | ICD-10-CM | POA: Diagnosis not present

## 2020-03-05 LAB — CBC WITH DIFFERENTIAL/PLATELET
Basophils Absolute: 0 10*3/uL (ref 0.0–0.1)
Basophils Relative: 0.4 % (ref 0.0–3.0)
Eosinophils Absolute: 0.1 10*3/uL (ref 0.0–0.7)
Eosinophils Relative: 0.6 % (ref 0.0–5.0)
HCT: 38.4 % (ref 36.0–46.0)
Hemoglobin: 12.7 g/dL (ref 12.0–15.0)
Lymphocytes Relative: 25.9 % (ref 12.0–46.0)
Lymphs Abs: 2.1 10*3/uL (ref 0.7–4.0)
MCHC: 33.2 g/dL (ref 30.0–36.0)
MCV: 93.6 fl (ref 78.0–100.0)
Monocytes Absolute: 0.5 10*3/uL (ref 0.1–1.0)
Monocytes Relative: 5.8 % (ref 3.0–12.0)
Neutro Abs: 5.5 10*3/uL (ref 1.4–7.7)
Neutrophils Relative %: 67.3 % (ref 43.0–77.0)
Platelets: 257 10*3/uL (ref 150.0–400.0)
RBC: 4.1 Mil/uL (ref 3.87–5.11)
RDW: 13.6 % (ref 11.5–15.5)
WBC: 8.2 10*3/uL (ref 4.0–10.5)

## 2020-03-05 LAB — COMPREHENSIVE METABOLIC PANEL
ALT: 8 U/L (ref 0–35)
AST: 13 U/L (ref 0–37)
Albumin: 4.3 g/dL (ref 3.5–5.2)
Alkaline Phosphatase: 40 U/L (ref 39–117)
BUN: 7 mg/dL (ref 6–23)
CO2: 26 mEq/L (ref 19–32)
Calcium: 9.1 mg/dL (ref 8.4–10.5)
Chloride: 105 mEq/L (ref 96–112)
Creatinine, Ser: 0.87 mg/dL (ref 0.40–1.20)
GFR: 95.09 mL/min (ref >60.00)
Glucose, Bld: 88 mg/dL (ref 70–99)
Potassium: 4.1 mEq/L (ref 3.5–5.1)
Sodium: 137 mEq/L (ref 135–145)
Total Bilirubin: 0.4 mg/dL (ref 0.2–1.2)
Total Protein: 7 g/dL (ref 6.0–8.3)
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
## 2020-03-06 LAB — HSV(HERPES SIMPLEX VRS) I + II AB-IGG
HAV 1 IGG,TYPE SPECIFIC AB: 2 index — ABNORMAL HIGH
HSV 2 IGG,TYPE SPECIFIC AB: <0.9 index

## 2020-03-06 NOTE — Progress Notes (Signed)
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    This virus usually causes cold sores  In the mouth or on the lips, but can also occasionally cause genital herpes. What you have currently is NOT herpes (they are painful blistering lesions  usually in the vagina or on the labia )      No treatment is needed at this time.  Regards,   Duncan Dull, MD

## 2020-03-17 ENCOUNTER — Ambulatory Visit: Payer: 59 | Admitting: Internal Medicine

## 2020-03-17 ENCOUNTER — Other Ambulatory Visit: Payer: Self-pay | Admitting: Internal Medicine

## 2020-03-24 ENCOUNTER — Ambulatory Visit (INDEPENDENT_AMBULATORY_CARE_PROVIDER_SITE_OTHER): Payer: 59 | Admitting: Internal Medicine

## 2020-03-24 ENCOUNTER — Encounter: Payer: Self-pay | Admitting: Internal Medicine

## 2020-03-24 ENCOUNTER — Other Ambulatory Visit: Payer: Self-pay

## 2020-03-24 DIAGNOSIS — F418 Other specified anxiety disorders: Secondary | ICD-10-CM

## 2020-03-24 DIAGNOSIS — A630 Anogenital (venereal) warts: Secondary | ICD-10-CM | POA: Diagnosis not present

## 2020-03-24 DIAGNOSIS — R69 Illness, unspecified: Secondary | ICD-10-CM | POA: Diagnosis not present

## 2020-03-24 NOTE — Progress Notes (Signed)
Subjective:  Patient ID: Lisa Mitchell, female    DOB: 04/07/2001  Age: 19 y.o. 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  HPI Lisa Mitchell presents for follow up on recent diagnosis of painless anogenital warts noted on right buttock several weeks ago .  Sexual history reviewed,  Vaccination history did confirm the HPV vaccine had been given as an adolescent. 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  Patient returns today due to continued distress over the diagnosis of having HPV, and is requesting testing for HPV.  Her boyfriend told her that he went to get tested at her request and his test was negative.  Reviewed her last PAP smear i n March 2021 which was negative for hi risk HPV   Her warts have spontanously  disappeared with the exception of one remaining wart which has diminished in size.   Outpatient Medications Prior to Visit  Medication Sig Dispense Refill  . ELINEST 0.3-30 MG-MCG tablet Take 1 tablet by mouth once daily 28 tablet 0   No facility-administered medications prior to visit.    Review of Systems;  Patient denies headache, fevers, malaise, unintentional weight loss, skin rash, eye pain, sinus congestion and sinus pain, sore throat, dysphagia,  hemoptysis , cough, dyspnea, wheezing, chest pain, palpitations, orthopnea, edema, abdominal pain, nausea, melena, diarrhea, constipation, flank pain, dysuria, hematuria, urinary  Frequency, nocturia, numbness, tingling, seizures,  Focal weakness, Loss of consciousness,  Tremor, insomnia, depression, anxiety, and suicidal ideation.      Objective:  BP 110/64 (BP Location: Left Arm, Patient Position: Sitting, Cuff Size: Normal)   Pulse (!) 103   Temp 98.2 F (36.8 C) (Oral)   Resp 15   Ht 5\' 5"  (1.651 m)   Wt 151 lb (68.5 kg)   SpO2 99%   BMI 25.13 kg/m   BP Readings from Last 3 Encounters:  03/24/20 110/64  03/05/20 130/80    07/23/19 100/68    Wt Readings from Last 3 Encounters:  03/24/20 151 lb (68.5 kg)  03/05/20 151 lb (68.5 kg)  07/23/19 136 lb 6.4 oz (61.9 kg)    General appearance: alert, cooperative and appears stated age Ears: normal TM's and external ear canals both ears Throat: lips, mucosa, and tongue normal; teeth and gums normal Neck: no adenopathy, no carotid bruit, supple, symmetrical, trachea midline and thyroid not enlarged, symmetric, no tenderness/mass/nodules Back: symmetric, no curvature. ROM normal. No CVA tenderness. Lungs: clear to auscultation bilaterally Heart: regular rate and rhythm, S1, S2 normal, no murmur, click, rub or gallop Abdomen: soft, non-tender; bowel sounds normal; no masses,  no organomegaly Pulses: 2+ and symmetric Skin: Skin color, texture, turgor normal. No rashes or lesions Lymph nodes: Cervical, supraclavicular, and axillary nodes normal.  No results found for: HGBA1C  Lab Results  Component Value Date   CREATININE 0.87 03/05/2020   CREATININE 0.99 06/13/2019   CREATININE 1.10 04/17/2018    Lab Results  Component Value Date   WBC 8.2 03/05/2020   HGB 12.7 03/05/2020   HCT 38.4 03/05/2020   PLT 257.0 03/05/2020   GLUCOSE 88 03/05/2020   CHOL 153 06/14/2018   TRIG 47.0 06/14/2018   HDL 59.80 06/14/2018   LDLCALC 84 06/14/2018   ALT 8 03/05/2020   AST 13 03/05/2020   NA 137 03/05/2020   K 4.1 03/05/2020   CL 105 03/05/2020   CREATININE 0.87 03/05/2020   BUN 7  03/05/2020   CO2 26 03/05/2020   TSH 0.46 02/26/2019   INR 1.1 (H) 11/11/2016    No results found.  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 She is requesting HPV testing and will return for cervical cytology. Testing for HPV 6,11 and other low risk subtypes will be done       Anxiety about health    counselling and reassurance given.         I provided  30  minutes of  face-to-face time during this encounter reviewing patient's current problems and past surgeries, labs and imaging studies, providing counseling on the above mentioned problems , and coordination  of care .  I am having Kenyona B. Gayman maintain her Elinest.  No orders of the defined types were placed in this encounter.   There are no discontinued medications.  Follow-up: No follow-ups on file.   Sherlene Shams, MD

## 2020-03-24 NOTE — Assessment & Plan Note (Addendum)
Her warts are spontaneously resolving. She is requesting HPV testing and will return for cervical cytology. Testing for HPV 6,11 and other low risk subtypes will be done

## 2020-03-25 DIAGNOSIS — F418 Other specified anxiety disorders: Secondary | ICD-10-CM | POA: Insufficient documentation

## 2020-03-25 DIAGNOSIS — Z03818 Encounter for observation for suspected exposure to other biological agents ruled out: Secondary | ICD-10-CM | POA: Diagnosis not present

## 2020-03-25 DIAGNOSIS — Z1152 Encounter for screening for COVID-19: Secondary | ICD-10-CM | POA: Diagnosis not present

## 2020-03-25 NOTE — Assessment & Plan Note (Signed)
counselling and reassurance given.

## 2020-03-27 ENCOUNTER — Other Ambulatory Visit: Payer: Self-pay

## 2020-03-27 ENCOUNTER — Ambulatory Visit (INDEPENDENT_AMBULATORY_CARE_PROVIDER_SITE_OTHER): Payer: 59 | Admitting: Internal Medicine

## 2020-03-27 ENCOUNTER — Encounter: Payer: Self-pay | Admitting: Internal Medicine

## 2020-03-27 VITALS — BP 110/72 | HR 90 | Temp 98.7°F | Resp 14 | Ht 65.0 in | Wt 151.0 lb

## 2020-03-27 DIAGNOSIS — A630 Anogenital (venereal) warts: Secondary | ICD-10-CM | POA: Diagnosis not present

## 2020-03-27 DIAGNOSIS — R69 Illness, unspecified: Secondary | ICD-10-CM | POA: Diagnosis not present

## 2020-03-27 NOTE — Assessment & Plan Note (Signed)
HPV testing was done today via [facility]
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
## 2020-04-13 ENCOUNTER — Other Ambulatory Visit: Payer: Self-pay | Admitting: Internal Medicine

## 2020-04-18 ENCOUNTER — Telehealth: Payer: Self-pay | Admitting: Internal Medicine

## 2020-04-18 NOTE — Telephone Encounter (Signed)
Patient's low risk HPV screen has not resulted after 3 weeks of waiting.  Lisa Mitchell was told today (on our second call to inquire about the results)  That "somebody at [facility] changed the order from the requested  low risk HPV serotype screen to the unwanted High  risk HPV and sent it off .  I am waiting to hear how [facility] plans to rectify their mistake.

## 2020-04-29 LAB — HPV, HIGH+LOW-RISK
HPV DNA ( HIGH RISK): DETECTED — AB
HPV DNA Low Risk: NOT DETECTED

## 2020-04-30 ENCOUNTER — Telehealth: Payer: Self-pay | Admitting: Internal Medicine

## 2020-04-30 DIAGNOSIS — R87612 Low grade squamous intraepithelial lesion on cytologic smear of cervix (LGSIL): Secondary | ICD-10-CM

## 2020-04-30 NOTE — Assessment & Plan Note (Signed)
PAP smear was normal,  High risk HPV negative march 2021. Marland Kitchen Repeat HPV testing In August due to the development of genital warts was POSITIVE FOR HIGH RISK,  Negative for low risk (resulted by [facility] Apr 27 2020 !!)  ,  Will repeat PAP March 2022

## 2020-05-09 ENCOUNTER — Other Ambulatory Visit: Payer: Self-pay | Admitting: Internal Medicine

## 2020-05-21 DIAGNOSIS — Z20822 Contact with and (suspected) exposure to covid-19: Secondary | ICD-10-CM | POA: Diagnosis not present

## 2020-05-21 DIAGNOSIS — Z03818 Encounter for observation for suspected exposure to other biological agents ruled out: Secondary | ICD-10-CM | POA: Diagnosis not present

## 2020-05-21 DIAGNOSIS — U071 COVID-19: Secondary | ICD-10-CM | POA: Diagnosis not present

## 2020-05-27 DIAGNOSIS — Z20822 Contact with and (suspected) exposure to covid-19: Secondary | ICD-10-CM | POA: Diagnosis not present

## 2020-05-27 DIAGNOSIS — U071 COVID-19: Secondary | ICD-10-CM | POA: Diagnosis not present

## 2020-05-27 DIAGNOSIS — Z03818 Encounter for observation for suspected exposure to other biological agents ruled out: Secondary | ICD-10-CM | POA: Diagnosis not present

## 2020-05-30 ENCOUNTER — Other Ambulatory Visit: Payer: Self-pay

## 2020-05-30 ENCOUNTER — Telehealth: Payer: 59 | Admitting: Internal Medicine

## 2020-05-30 ENCOUNTER — Other Ambulatory Visit: Payer: 59

## 2020-05-30 DIAGNOSIS — Z20822 Contact with and (suspected) exposure to covid-19: Secondary | ICD-10-CM | POA: Diagnosis not present

## 2020-06-01 LAB — NOVEL CORONAVIRUS, NAA: SARS-CoV-2, NAA: NOT DETECTED

## 2020-06-01 LAB — SARS-COV-2, NAA 2 DAY TAT

## 2020-06-06 ENCOUNTER — Other Ambulatory Visit: Payer: Self-pay | Admitting: Internal Medicine

## 2020-06-08 ENCOUNTER — Telehealth: Payer: Self-pay | Admitting: Internal Medicine

## 2020-06-08 NOTE — Telephone Encounter (Signed)
Hi Karmin!   I will send you a doctor's note stating that you have had no symptoms of infection and continue return to work.  TT

## 2020-07-08 ENCOUNTER — Other Ambulatory Visit: Payer: Self-pay | Admitting: Internal Medicine

## 2020-07-08 ENCOUNTER — Ambulatory Visit: Payer: 59

## 2020-07-10 IMAGING — CR DG NECK SOFT TISSUE
1 series · 2 of 2 positions shown · non-contrast
Comparison: None.

CLINICAL DATA: Three weeks dysphagia

EXAM:
NECK SOFT TISSUES - 1+ VIEW

[Series 1: dg neck soft tissue · 0.14mm/px · 2 of 2 slices shown]
[im 1/2]
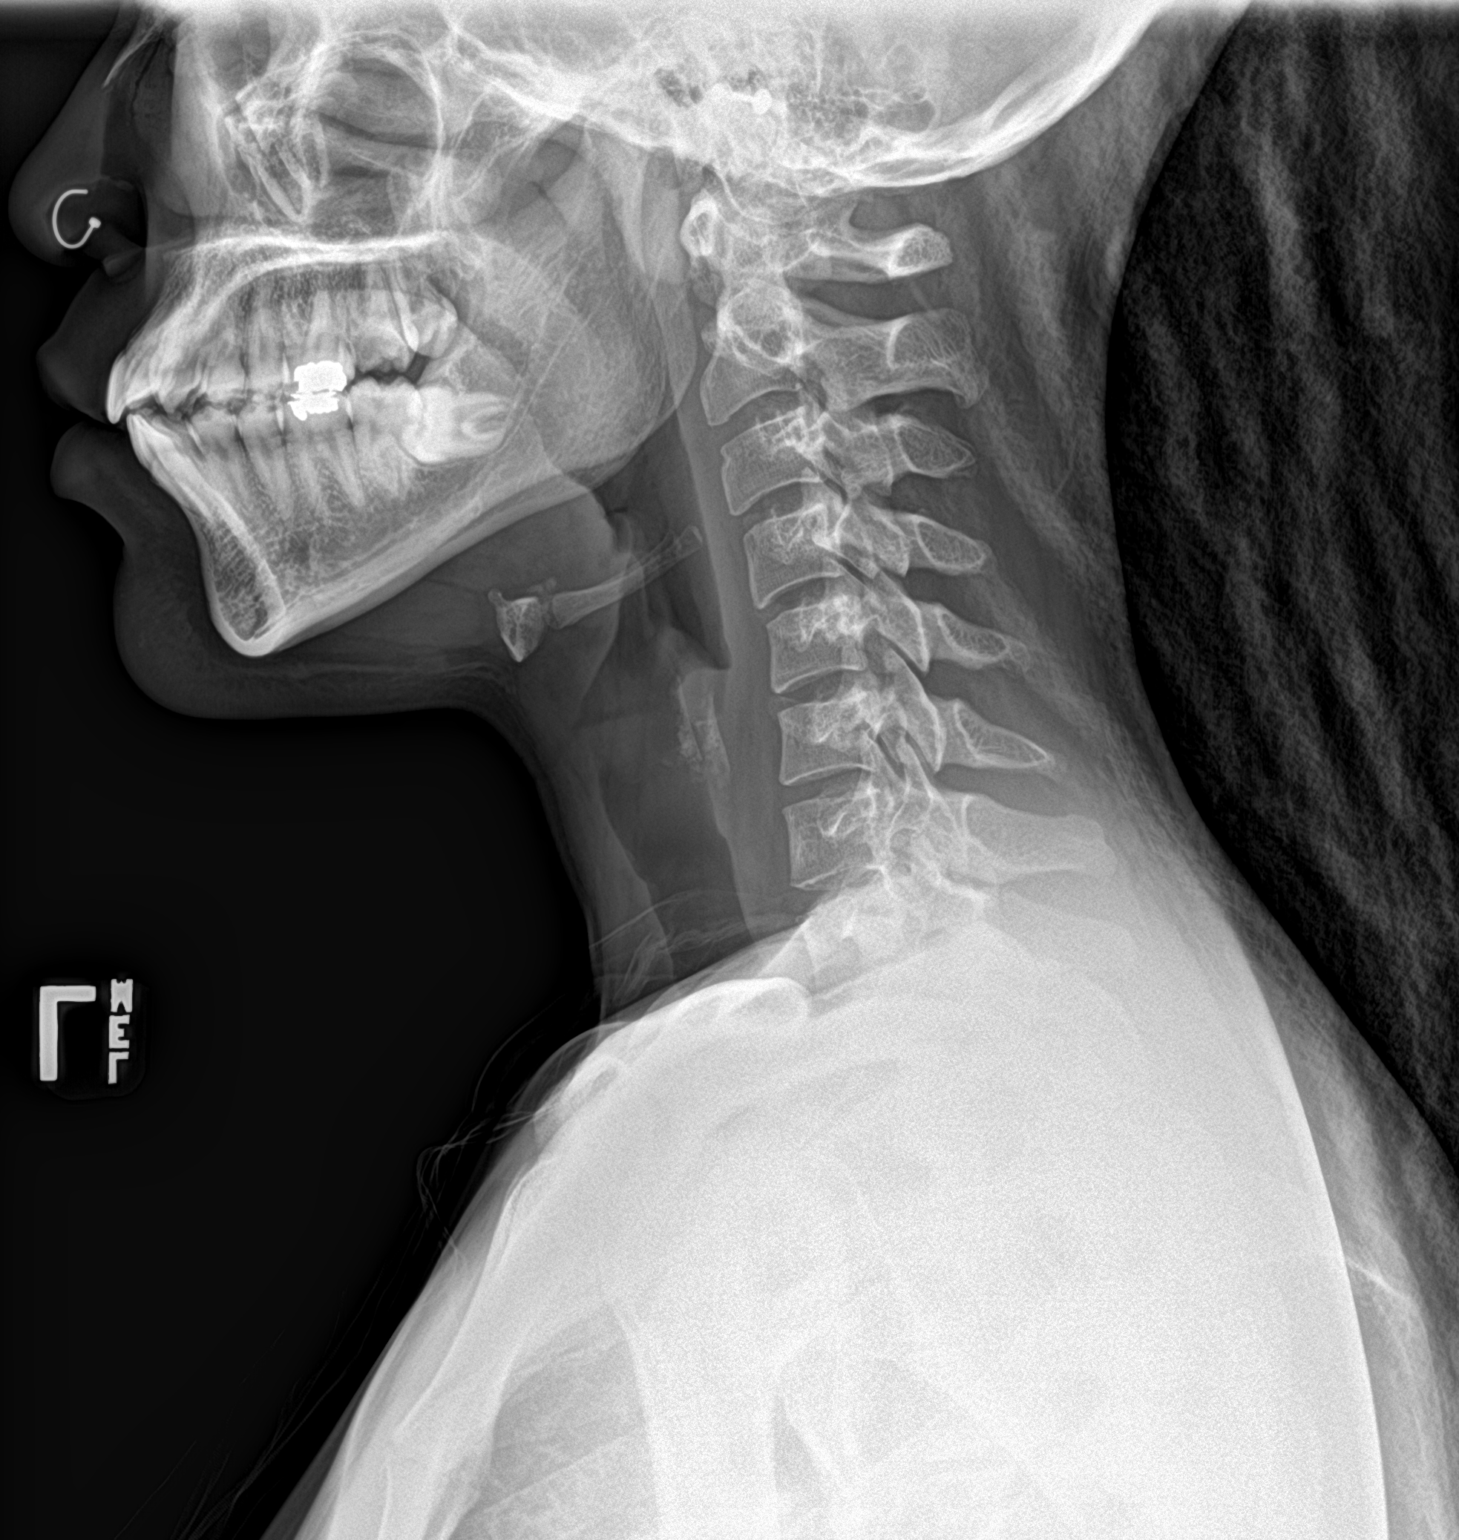
[im 2/2]
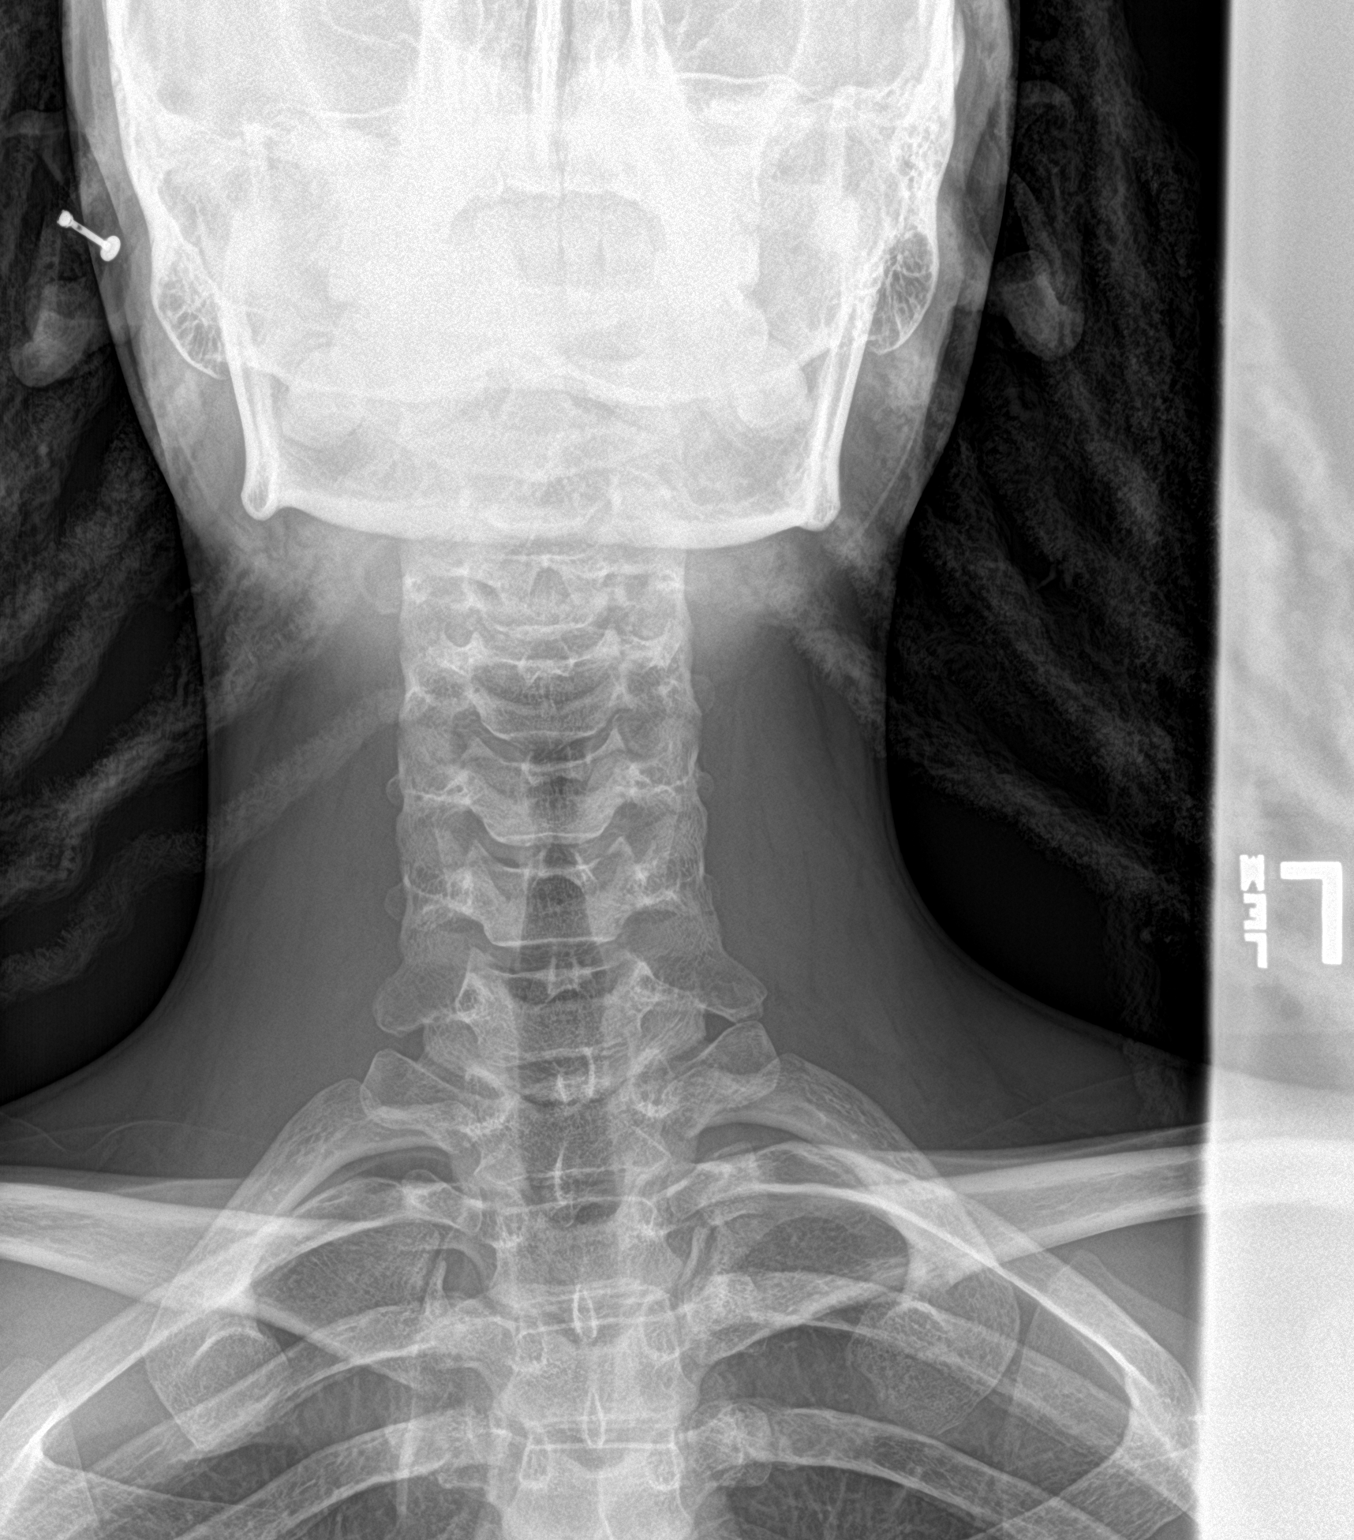

[2 of 2 positions shown; findings below may reference images not displayed]

FINDINGS: There is no evidence of retropharyngeal soft tissue swelling or
epiglottic enlargement. The cervical airway is unremarkable and no
radio-opaque foreign body identified.
IMPRESSION: Negative.

## 2020-07-11 ENCOUNTER — Other Ambulatory Visit (HOSPITAL_COMMUNITY): Payer: Self-pay | Admitting: Internal Medicine

## 2020-07-11 ENCOUNTER — Ambulatory Visit: Payer: 59 | Attending: Internal Medicine

## 2020-07-11 DIAGNOSIS — Z23 Encounter for immunization: Secondary | ICD-10-CM

## 2020-07-11 NOTE — Progress Notes (Signed)
   Covid-19 Vaccination Clinic  Name:  Lisa Mitchell    MRN: [id number] DOB: 2001-05-01  07/11/2020  [name] was observed post Covid-19 immunization for 15 minutes without incident. She was provided with Vaccine Information Sheet and instruction to access the V-Safe system.   [name] was instructed to call 911 with any severe reactions post vaccine: Marland Kitchen Difficulty breathing  . Swelling of face and throat  . A fast heartbeat  . A bad rash all over body  . Dizziness and weakness   Immunizations Administered    Name Date Dose VIS Date Route   JANSSEN COVID-19 VACCINE 07/11/2020 11:59 AM 0.5 mL 03/05/2020 Intramuscular   Manufacturer: [employer]   Lot: [id number]   NDC: [phone]

## 2020-07-23 ENCOUNTER — Other Ambulatory Visit (HOSPITAL_COMMUNITY)
Admission: RE | Admit: 2020-07-23 | Discharge: 2020-07-23 | Disposition: A | Discharge status: Home / Self Care | Payer: 59 | Source: Ambulatory Visit | Attending: Internal Medicine | Admitting: Internal Medicine

## 2020-07-23 ENCOUNTER — Encounter: Payer: Self-pay | Admitting: Internal Medicine

## 2020-07-23 ENCOUNTER — Ambulatory Visit (INDEPENDENT_AMBULATORY_CARE_PROVIDER_SITE_OTHER): Payer: 59 | Admitting: Internal Medicine

## 2020-07-23 ENCOUNTER — Other Ambulatory Visit: Payer: Self-pay

## 2020-07-23 VITALS — BP 94/64 | HR 87 | Temp 98.6°F | Resp 15 | Ht 65.0 in | Wt 152.2 lb

## 2020-07-23 DIAGNOSIS — Z0000 Encounter for general adult medical examination without abnormal findings: Secondary | ICD-10-CM

## 2020-07-23 DIAGNOSIS — N921 Excessive and frequent menstruation with irregular cycle: Secondary | ICD-10-CM | POA: Diagnosis not present

## 2020-07-23 DIAGNOSIS — Z124 Encounter for screening for malignant neoplasm of cervix: Secondary | ICD-10-CM | POA: Diagnosis not present

## 2020-07-23 DIAGNOSIS — R87612 Low grade squamous intraepithelial lesion on cytologic smear of cervix (LGSIL): Secondary | ICD-10-CM | POA: Diagnosis not present

## 2020-07-23 DIAGNOSIS — R69 Illness, unspecified: Secondary | ICD-10-CM | POA: Diagnosis not present

## 2020-07-23 DIAGNOSIS — Z3041 Encounter for surveillance of contraceptive pills: Secondary | ICD-10-CM | POA: Diagnosis not present

## 2020-07-23 DIAGNOSIS — A630 Anogenital (venereal) warts: Secondary | ICD-10-CM

## 2020-07-23 MED ORDER — SULFAMETHOXAZOLE-TRIMETHOPRIM 800-160 MG PO TABS: 1.0000 | ORAL_TABLET | Freq: Two times a day (BID) | ORAL | 0 refills | Status: DC

## 2020-07-23 NOTE — Patient Instructions (Signed)
I recommend trying melatonin for your insomnia.  It is not a sedative,  But must be taken on  a regular basis to help your internal clock.  Take every evening after dinner start with 3 mg dose   Max effective dose is 10  mg  You can also try using " Headspace".  This  is a phone app that teaches you to meditate and empty your mind of thoughts that interfere with space   I have called in Septra DS .  This is an antibiotic that treats skin infections caused by certain bacteria types   that tend to cause folliculitis . Take it twice daily with a meal.     Please take a probiotic ( Align, Floraque or Culturelle)  Or eat a serving of yogurt daily For a minimum of 1 weeks to prevent a serious antibiotic associated diarrhea  Called clostridium dificile colitis    Health Maintenance, Female Adopting a healthy lifestyle and getting preventive care are important in promoting health and wellness. Ask your health care provider about:  The right schedule for you to have regular tests and exams.  Things you can do on your own to prevent diseases and keep yourself healthy. What should I know about diet, weight, and exercise? Eat a healthy diet  Eat a diet that includes plenty of vegetables, fruits, low-fat dairy products, and lean protein.  Do not eat a lot of foods that are high in solid fats, added sugars, or sodium.   Maintain a healthy weight Body mass index (BMI) is used to identify weight problems. It estimates body fat based on height and weight. Your health care provider can help determine your BMI and help you achieve or maintain a healthy weight. Get regular exercise Get regular exercise. This is one of the most important things you can do for your health. Most adults should:  Exercise for at least 150 minutes each week. The exercise should increase your heart rate and make you sweat (moderate-intensity exercise).  Do strengthening exercises at least twice a week. This is in addition to the  moderate-intensity exercise.  Spend less time sitting. Even light physical activity can be beneficial. Watch cholesterol and blood lipids Have your blood tested for lipids and cholesterol at 22 years of age, then have this test every 5 years. Have your cholesterol levels checked more often if:  Your lipid or cholesterol levels are high.  You are older than 22 years of age.  You are at high risk for heart disease. What should I know about cancer screening? Depending on your health history and family history, you may need to have cancer screening at various ages. This may include screening for:  Breast cancer.  Cervical cancer.  Colorectal cancer.  Skin cancer.  Lung cancer. What should I know about heart disease, diabetes, and high blood pressure? Blood pressure and heart disease  High blood pressure causes heart disease and increases the risk of stroke. This is more likely to develop in people who have high blood pressure readings, are of African descent, or are overweight.  Have your blood pressure checked: ? Every 3-5 years if you are 24-44 years of age. ? Every year if you are 67 years old or older. Diabetes Have regular diabetes screenings. This checks your fasting blood sugar level. Have the screening done:  Once every three years after age 21 if you are at a normal weight and have a low risk for diabetes.  More often and at  a younger age if you are overweight or have a high risk for diabetes. What should I know about preventing infection? Hepatitis B If you have a higher risk for hepatitis B, you should be screened for this virus. Talk with your health care provider to find out if you are at risk for hepatitis B infection. Hepatitis C Testing is recommended for:  Everyone born from [birth year] through 1967.  Anyone with known risk factors for hepatitis C. Sexually transmitted infections (STIs)  Get screened for STIs, including gonorrhea and chlamydia, if: ? You are  sexually active and are younger than 22 years of age. ? You are older than 22 years of age and your health care provider tells you that you are at risk for this type of infection. ? Your sexual activity has changed since you were last screened, and you are at increased risk for chlamydia or gonorrhea. Ask your health care provider if you are at risk.  Ask your health care provider about whether you are at high risk for HIV. Your health care provider may recommend a prescription medicine to help prevent HIV infection. If you choose to take medicine to prevent HIV, you should first get tested for HIV. You should then be tested every 3 months for as long as you are taking the medicine. Pregnancy  If you are about to stop having your period (premenopausal) and you may become pregnant, seek counseling before you get pregnant.  Take 400 to 800 micrograms (mcg) of folic acid every day if you become pregnant.  Ask for birth control (contraception) if you want to prevent pregnancy. Osteoporosis and menopause Osteoporosis is a disease in which the bones lose minerals and strength with aging. This can result in bone fractures. If you are 39 years old or older, or if you are at risk for osteoporosis and fractures, ask your health care provider if you should:  Be screened for bone loss.  Take a calcium or vitamin D supplement to lower your risk of fractures.  Be given hormone replacement therapy (HRT) to treat symptoms of menopause. Follow these instructions at home: Lifestyle  Do not use any products that contain nicotine or tobacco, such as cigarettes, e-cigarettes, and chewing tobacco. If you need help quitting, ask your health care provider.  Do not use street drugs.  Do not share needles.  Ask your health care provider for help if you need support or information about quitting drugs. Alcohol use  Do not drink alcohol if: ? Your health care provider tells you not to drink. ? You are  pregnant, may be pregnant, or are planning to become pregnant.  If you drink alcohol: ? Limit how much you use to 0-1 drink a day. ? Limit intake if you are breastfeeding.  Be aware of how much alcohol is in your drink. In the [place] one drink equals one 12 oz bottle of beer (355 mL), one 5 oz glass of wine (148 mL), or one 1 oz glass of hard liquor (44 mL). General instructions  Schedule regular health, dental, and eye exams.  Stay current with your vaccines.  Tell your health care provider if: ? You often feel depressed. ? You have ever been abused or do not feel safe at home. Summary  Adopting a healthy lifestyle and getting preventive care are important in promoting health and wellness.  Follow your health care provider's instructions about healthy diet, exercising, and getting tested or screened for diseases.  Follow your health care provider's  instructions on monitoring your cholesterol and blood pressure. This information is not intended to replace advice given to you by your health care provider. Make sure you discuss any questions you have with your health care provider. Document Revised: 04/26/2018 Document Reviewed: 04/26/2018 Elsevier Patient Education  2021 [employer].

## 2020-07-23 NOTE — Progress Notes (Signed)
Patient ID: Lisa Mitchell, female    DOB: 15-Jul-2000  Age: 20 y.o. MRN: [id number]  The patient is here for annual  examination and management of other chronic and acute problems.  This visit occurred during the SARS-CoV-2 public health emergency.  Safety protocols were in place, including screening questions prior to the visit, additional usage of staff PPE, and extensive cleaning of exam room while observing appropriate contact time as indicated for disinfecting solutions.     The risk factors are reflected in the social history.  The roster of all physicians providing medical care to patient - is listed in the Snapshot section of the chart.  Activities of daily living:  The patient is 100% independent in all ADLs: dressing, toileting, feeding as well as independent mobility  Home safety : The patient has smoke detectors in the home. They wear seatbelts.  There are no firearms at home. There is no violence in the home.   There are  risks for hepatitis, STDs or HIV because of recent unprotected sex.  There is  no   history of blood transfusion. They have no travel history to infectious disease endemic areas of the world.  The patient has seen their dentist in the last six month. They have seen their eye doctor in the last year. She denies  hearing difficulty with regard to whispered voices and some television programs.  They have deferred audiologic testing in the last year.  They do not  have excessive sun exposure. Discussed the need for sun protection: hats, long sleeves and use of sunscreen if there is significant sun exposure.   Diet: the importance of a healthy diet is discussed. They do have a healthy diet.  The benefits of regular aerobic exercise were discussed. She walks 4 times per week ,  20 minutes.   Depression screen: there are no signs or vegative symptoms of depression- irritability, change in appetite, anhedonia, sadness/tearfullness.  Cognitive assessment: the patient  manages all their financial and personal affairs and is actively engaged. They could relate day,date,year and events; recalled 2/3 objects at 3 minutes; performed clock-face test normally.  The following portions of the patient's history were reviewed and updated as appropriate: allergies, current medications, past family history, past medical history,  past surgical history, past social history  and problem list.  Visual acuity was not assessed per patient preference since she has regular follow up with her ophthalmologist. Hearing and body mass index were assessed and reviewed.   During the course of the visit the patient was educated and counseled about appropriate screening and preventive services including : fall prevention , diabetes screening, nutrition counseling, colorectal cancer screening, and recommended immunizations.    CC: The primary encounter diagnosis was Cervical cancer screening. 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  History Lisa Mitchell has a past medical history of Asthma and Headache(784.0).   She has no past surgical history on file.   Her family history is not on file.She reports that she has never smoked. She has never used smokeless tobacco. She reports that she does not drink alcohol and does not use drugs.  Outpatient Medications Prior to Visit  Medication Sig Dispense Refill  . ELINEST 0.3-30 MG-MCG tablet Take 1 tablet by mouth once daily 28 tablet 0   No facility-administered medications prior to visit.    Review of  Systems  Objective:  BP 94/64 (BP Location: Left Arm, Patient Position: Sitting, Cuff Size: Normal)   Pulse 87   Temp 98.6 F (37 C) (Oral)   Resp 15   Ht 5\' 5"  (1.651 m)   Wt 152 lb 3.2 oz (69 kg)   SpO2 99%   BMI 25.33 kg/m   Physical Exam  General  Appearance:    Alert, cooperative, no distress, appears stated age  Head:    Normocephalic, without obvious abnormality, atraumatic  Eyes:    PERRL, conjunctiva/corneas clear, EOM's intact, fundi    benign, both eyes  Ears:    Normal TM's and external ear canals, both ears  Nose:   Nares normal, septum midline, mucosa normal, no drainage    or sinus tenderness  Throat:   Lips, mucosa, and tongue normal; teeth and gums normal  Neck:   Supple, symmetrical, trachea midline, no adenopathy;    thyroid:  no enlargement/tenderness/nodules; no carotid   bruit or JVD  Back:     Symmetric, no curvature, ROM normal, no CVA tenderness  Lungs:     Clear to auscultation bilaterally, respirations unlabored  Chest Wall:    No tenderness or deformity   Heart:    Regular rate and rhythm, S1 and S2 normal, no murmur, rub   or gallop  Breast Exam:    No tenderness, masses, or nipple abnormality  Abdomen:     Soft, non-tender, bowel sounds active all four quadrants,    no masses, no organomegaly  Genitalia:    Pelvic: cervix normal in appearance, external genitalia normal, no adnexal masses or tenderness, no cervical motion tenderness, rectovaginal septum normal, uterus normal size, shape, and consistency and vagina normal without discharge  Extremities:   Extremities normal, atraumatic, no cyanosis or edema  Pulses:   2+ and symmetric all extremities  Skin:   Several grouped vesicles on left perineal area .  Normal skin color.  texture, turgor normal, no rashes or lesions  Lymph nodes:   Cervical, supraclavicular, and axillary nodes normal  Neurologic:   CNII-XII intact, normal strength, sensation and reflexes    throughout     Assessment & Plan:   Problem List Items Addressed This Visit      Unprioritized   Visit for preventive health examination    age appropriate education and counseling updated, referrals for preventative services and immunizations addressed, dietary and smoking counseling  addressed, most recent labs reviewed.  I have personally reviewed and have noted:  1) the patient's medical and social history 2) The pt's use of alcohol, tobacco, and illicit drugs 3) The patient's current medications and supplements 4) Functional ability including ADL's, fall risk, home safety risk, hearing and visual impairment 5) Diet and physical activities 6) Evidence for depression or mood disorder 7) The patient's height, weight, and BMI have been recorded in the chart  I have made referrals, and provided counseling and education based on review of the above      Menometrorrhagia    Persistent,  Per report, without anemia.  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   Empiric abx  for MRSA ordered with Septra DS . Daily use of Probiotics for  3 weeks advised to reduce risk of C dificile colitis.       Relevant Medications   sulfamethoxazole-trimethoprim (BACTRIM DS) 800-160 MG tablet    Other Visit Diagnoses    Cervical cancer screening    -  Primary   Relevant Orders   Cytology - PAP( [facility])      I am having Lisa Mitchell start on sulfamethoxazole-trimethoprim. I am also having her maintain her Elinest.  Meds ordered this encounter  Medications  . sulfamethoxazole-trimethoprim (BACTRIM DS) 800-160 MG tablet    Sig: Take 1 tablet by mouth 2 (two) times daily.    Dispense:  14 tablet    Refill:  0    There are no discontinued medications.  Follow-up: No follow-ups on file.   Sherlene Shams, MD

## 2020-07-24 LAB — COMPREHENSIVE METABOLIC PANEL
ALT: 11 U/L (ref 0–35)
AST: 15 U/L (ref 0–37)
Albumin: 4.2 g/dL (ref 3.5–5.2)
Alkaline Phosphatase: 39 U/L (ref 39–117)
BUN: 8 mg/dL (ref 6–23)
CO2: 28 mEq/L (ref 19–32)
Calcium: 9.4 mg/dL (ref 8.4–10.5)
Chloride: 104 mEq/L (ref 96–112)
Creatinine, Ser: 1 mg/dL (ref 0.40–1.20)
GFR: 80.49 mL/min (ref >60.00)
Glucose, Bld: 89 mg/dL (ref 70–99)
Potassium: 4.2 mEq/L (ref 3.5–5.1)
Sodium: 138 mEq/L (ref 135–145)
Total Bilirubin: 0.3 mg/dL (ref 0.2–1.2)
Total Protein: 7.2 g/dL (ref 6.0–8.3)

## 2020-07-24 NOTE — Assessment & Plan Note (Signed)
Persistent,  Per report, without anemia.  She is requesting a comprehensive approach to her problem;  advised to return to Dr Cherly Hensen

## 2020-07-24 NOTE — Assessment & Plan Note (Signed)

## 2020-07-24 NOTE — Assessment & Plan Note (Signed)
Source suspected to be folliculitis from shaving given the singular presence.   Empiric abx for MRSA ordered with Septra DS . Daily use of Probiotics for  3 weeks advised to reduce risk of C dificile colitis.

## 2020-07-24 NOTE — Assessment & Plan Note (Signed)
Repeat PAP done today

## 2020-07-30 ENCOUNTER — Encounter: Payer: Self-pay | Admitting: Internal Medicine

## 2020-07-30 ENCOUNTER — Other Ambulatory Visit: Payer: Self-pay | Admitting: Internal Medicine

## 2020-07-30 ENCOUNTER — Telehealth: Payer: Self-pay | Admitting: Internal Medicine

## 2020-07-30 DIAGNOSIS — A568 Sexually transmitted chlamydial infection of other sites: Secondary | ICD-10-CM

## 2020-07-30 DIAGNOSIS — Z8619 Personal history of other infectious and parasitic diseases: Secondary | ICD-10-CM | POA: Insufficient documentation

## 2020-07-30 LAB — CYTOLOGY - PAP
Chlamydia: POSITIVE — AB
Comment: NEGATIVE
Comment: NEGATIVE
Comment: NEGATIVE
Comment: NORMAL
HSV1: NEGATIVE
HSV2: NEGATIVE
Neisseria Gonorrhea: NEGATIVE
Trichomonas: NEGATIVE

## 2020-07-30 MED ORDER — DOXYCYCLINE HYCLATE 100 MG PO TABS: 100.0000 mg | ORAL_TABLET | Freq: Two times a day (BID) | ORAL | 0 refills | Status: DC

## 2020-07-30 NOTE — Telephone Encounter (Signed)
Pt called back and transferred to Dr. Darrick Huntsman.

## 2020-07-30 NOTE — Telephone Encounter (Signed)
Left voicemail on mobile line to call office to discuss PAP smear results.   I can  All her at 1:15 if she calls back otherwise at  The end of the day

## 2020-08-06 ENCOUNTER — Other Ambulatory Visit: Payer: Self-pay | Admitting: Internal Medicine

## 2021-01-26 ENCOUNTER — Other Ambulatory Visit: Payer: Self-pay | Admitting: Internal Medicine

## 2021-02-28 ENCOUNTER — Other Ambulatory Visit: Payer: Self-pay | Admitting: Internal Medicine

## 2021-03-04 ENCOUNTER — Other Ambulatory Visit: Payer: Self-pay | Admitting: Internal Medicine

## 2021-03-04 MED ORDER — LEVOFLOXACIN 500 MG PO TABS: 500.0000 mg | ORAL_TABLET | Freq: Every day | ORAL | 0 refills | Status: AC

## 2021-03-04 MED ORDER — PREDNISONE 10 MG PO TABS: ORAL_TABLET | ORAL | 0 refills | Status: DC

## 2021-04-11 ENCOUNTER — Other Ambulatory Visit: Payer: Self-pay | Admitting: Internal Medicine

## 2021-08-12 ENCOUNTER — Other Ambulatory Visit: Payer: Self-pay | Admitting: Internal Medicine

## 2021-09-07 ENCOUNTER — Other Ambulatory Visit: Payer: Self-pay | Admitting: Internal Medicine

## 2021-10-03 ENCOUNTER — Other Ambulatory Visit: Payer: Self-pay | Admitting: Internal Medicine

## 2021-10-12 ENCOUNTER — Telehealth: Payer: Self-pay | Admitting: Internal Medicine

## 2021-10-27 ENCOUNTER — Other Ambulatory Visit: Payer: Self-pay | Admitting: Internal Medicine

## 2021-10-27 NOTE — Telephone Encounter (Signed)
LMTCB patient needs appointment scheduled.

## 2021-11-05 ENCOUNTER — Other Ambulatory Visit: Payer: Self-pay | Admitting: Internal Medicine

## 2021-11-05 MED ORDER — ELINEST 0.3-30 MG-MCG PO TABS: 1.0000 | ORAL_TABLET | Freq: Every day | ORAL | 0 refills | Status: DC

## 2021-11-30 ENCOUNTER — Encounter: Payer: Self-pay | Admitting: Internal Medicine

## 2021-11-30 ENCOUNTER — Ambulatory Visit (INDEPENDENT_AMBULATORY_CARE_PROVIDER_SITE_OTHER): Payer: Self-pay | Admitting: Internal Medicine

## 2021-11-30 VITALS — BP 128/78 | HR 82 | Temp 98.8°F | Resp 20 | Ht 65.0 in | Wt 177.0 lb

## 2021-11-30 DIAGNOSIS — R635 Abnormal weight gain: Secondary | ICD-10-CM

## 2021-11-30 DIAGNOSIS — R5382 Chronic fatigue, unspecified: Secondary | ICD-10-CM

## 2021-11-30 DIAGNOSIS — R87612 Low grade squamous intraepithelial lesion on cytologic smear of cervix (LGSIL): Secondary | ICD-10-CM

## 2021-11-30 DIAGNOSIS — N921 Excessive and frequent menstruation with irregular cycle: Secondary | ICD-10-CM

## 2021-11-30 DIAGNOSIS — E663 Overweight: Secondary | ICD-10-CM

## 2021-11-30 MED ORDER — ELINEST 0.3-30 MG-MCG PO TABS
1.0000 | ORAL_TABLET | Freq: Every day | ORAL | 11 refills | Status: DC
Start: 2021-11-30 — End: 2022-12-01

## 2021-11-30 NOTE — Progress Notes (Unsigned)
Subjective:  Patient ID: Lisa Mitchell, female    DOB: [birth year]  Age: 21 y.o. MRN: [id number]  CC: The primary encounter diagnosis was Weight gain. Diagnoses of Chronic fatigue, Overweight (BMI 25.0-29.9), Menometrorrhagia, and LGSIL on Pap smear of cervix were also pertinent to this visit.   HPI AUDERY WASSENAAR presents for  medication  refill on her birth control.  She is currently not sexually active and is having regular menses during the placebo part of her pill pack.  She reports a 25 lb weight gain over the last year .  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  Eats fast food nearly daily .      Outpatient Medications Prior to Visit  Medication Sig Dispense Refill   doxycycline (VIBRA-TABS) 100 MG tablet Take 1 tablet (100 mg total) by mouth 2 (two) times daily. 14 tablet 0   norgestrel-ethinyl estradiol (ELINEST) 0.3-30 MG-MCG tablet Take 1 tablet by mouth daily. 28 tablet 0   predniSONE (DELTASONE) 10 MG tablet 6 tablets on Day 1 , then reduce by 1 tablet daily until gone 21 tablet 0   No facility-administered medications prior to visit.    Review of Systems;  Patient denies headache, fevers, malaise, unintentional weight loss, skin rash, eye pain, sinus congestion and sinus pain, sore throat, dysphagia,  hemoptysis , cough, dyspnea, wheezing, chest pain, palpitations, orthopnea, edema, abdominal pain, nausea, melena, diarrhea, constipation, flank pain, dysuria, hematuria, urinary  Frequency, nocturia, numbness, tingling, seizures,  Focal weakness, Loss of consciousness,  Tremor, insomnia, depression, anxiety, and suicidal ideation.      Objective:  BP 128/78 (BP Location: Left Arm, Patient Position: Sitting, Cuff Size: Normal)   Pulse 82   Temp 98.8 F (37.1 C) (Oral)   Resp 20   Ht 5\' 5"  (1.651 m)   Wt 177 lb (80.3 kg)   SpO2 100%   BMI 29.45 kg/m   BP Readings from Last 3 Encounters:  11/30/21 128/78  07/23/20 94/64  03/27/20  110/72    Wt Readings from Last 3 Encounters:  11/30/21 177 lb (80.3 kg)  07/23/20 152 lb 3.2 oz (69 kg)  03/27/20 151 lb (68.5 kg)    General appearance: alert, cooperative and appears stated age Ears: normal TM's and external ear canals both ears Throat: lips, mucosa, and tongue normal; teeth and gums normal Neck: no adenopathy, no carotid bruit, supple, symmetrical, trachea midline and thyroid not enlarged, symmetric, no tenderness/mass/nodules Back: symmetric, no curvature. ROM normal. No CVA tenderness. Lungs: clear to auscultation bilaterally Heart: regular rate and rhythm, S1, S2 normal, no murmur, click, rub or gallop Abdomen: soft, non-tender; bowel sounds normal; no masses,  no organomegaly Pulses: 2+ and symmetric Skin: Skin color, texture, turgor normal. No rashes or lesions Lymph nodes: Cervical, supraclavicular, and axillary nodes normal.  No results found for: "HGBA1C"  Lab Results  Component Value Date   CREATININE 1.00 07/23/2020   CREATININE 0.87 03/05/2020   CREATININE 0.99 06/13/2019    Lab Results  Component Value Date   WBC 8.2 03/05/2020   HGB 12.7 03/05/2020   HCT 38.4 03/05/2020   PLT 257.0 03/05/2020   GLUCOSE 89 07/23/2020   CHOL 153 06/14/2018   TRIG 47.0 06/14/2018   HDL 59.80 06/14/2018   LDLCALC 84 06/14/2018   ALT 11 07/23/2020   AST 15 07/23/2020   NA 138 07/23/2020   K 4.2 07/23/2020   CL 104 07/23/2020   CREATININE 1.00  07/23/2020   BUN 8 07/23/2020   CO2 28 07/23/2020   TSH 0.46 02/26/2019   INR 1.1 (H) 11/11/2016    No results found.  Assessment & Plan:   Problem List Items Addressed This Visit     Overweight (BMI 25.0-29.9)    counselling given.  Her mother is a Database administrator. .  Encouraged to enlist mother's help in managing diet and guide her exercise regimen. Screening to be done today for diabetes,  Thyroid disorders        Menometrorrhagia    Improved regularity and flow with  oral contraception.  Continue use/       LGSIL on Pap smear of cervix    She is overdue for repeat PAP and will return in August       Other Visit Diagnoses     Weight gain    -  Primary   Relevant Orders   TSH   Hemoglobin A1c   Comprehensive metabolic panel   Chronic fatigue           Follow-up: No follow-ups on file.   Sherlene Shams, MD

## 2021-11-30 NOTE — Patient Instructions (Addendum)
We need to repeat your PAP smear this summer !    Try using Benefiber for the constipation (and also to suppress appetite) it is tasteless and mixes easily with any beverage   Labs today to rule out thyroid,  prediabetic state,  etc as a contributor to the weight gain   GoodRX may help you find the least expensive source for your birth control

## 2021-12-01 DIAGNOSIS — E663 Overweight: Secondary | ICD-10-CM | POA: Insufficient documentation

## 2021-12-01 LAB — COMPREHENSIVE METABOLIC PANEL
ALT: 12 U/L (ref 0–35)
AST: 15 U/L (ref 0–37)
Albumin: 4.2 g/dL (ref 3.5–5.2)
Alkaline Phosphatase: 50 U/L (ref 39–117)
BUN: 9 mg/dL (ref 6–23)
CO2: 27 mEq/L (ref 19–32)
Calcium: 9.1 mg/dL (ref 8.4–10.5)
Chloride: 103 mEq/L (ref 96–112)
Creatinine, Ser: 1 mg/dL (ref 0.40–1.20)
GFR: 79.72 mL/min (ref >60.00)
Glucose, Bld: 85 mg/dL (ref 70–99)
Potassium: 3.9 mEq/L (ref 3.5–5.1)
Sodium: 138 mEq/L (ref 135–145)
Total Bilirubin: 0.3 mg/dL (ref 0.2–1.2)
Total Protein: 6.9 g/dL (ref 6.0–8.3)

## 2021-12-01 LAB — TSH: TSH: 1.22 u[IU]/mL (ref 0.35–5.50)

## 2021-12-01 LAB — HEMOGLOBIN A1C: Hgb A1c MFr Bld: 5.2 % (ref 4.6–6.5)

## 2021-12-01 NOTE — Assessment & Plan Note (Signed)
Improved regularity and flow with oral contraception.  Continue use/

## 2021-12-01 NOTE — Assessment & Plan Note (Signed)
counselling given.  Her mother is a Database administrator. .  Encouraged to enlist mother's help in managing diet and guide her exercise regimen. Screening to be done today for diabetes,  Thyroid disorders

## 2021-12-01 NOTE — Assessment & Plan Note (Signed)
She is overdue for repeat PAP and will return in August

## 2022-01-12 ENCOUNTER — Encounter: Payer: Self-pay | Admitting: Internal Medicine

## 2022-02-22 ENCOUNTER — Ambulatory Visit (INDEPENDENT_AMBULATORY_CARE_PROVIDER_SITE_OTHER): Payer: Self-pay | Admitting: Internal Medicine

## 2022-02-22 DIAGNOSIS — R87612 Low grade squamous intraepithelial lesion on cytologic smear of cervix (LGSIL): Secondary | ICD-10-CM

## 2022-02-22 NOTE — Progress Notes (Signed)
Patient failed to keep scheduled appointment and will be charged a no show fee.   

## 2022-04-28 NOTE — Telephone Encounter (Signed)
Error

## 2022-04-28 NOTE — Telephone Encounter (Signed)
MyChart messgae sent to patient. 

## 2022-12-01 ENCOUNTER — Other Ambulatory Visit: Payer: Self-pay | Admitting: Internal Medicine

## 2022-12-01 DIAGNOSIS — E663 Overweight: Secondary | ICD-10-CM

## 2022-12-01 NOTE — Telephone Encounter (Signed)
Please schedule Lisa Mitchell FOR A CPE WITH PAP SMEAR AND LABS.  I HAVE REFILLED HER BIRTH CONTROL

## 2023-02-21 ENCOUNTER — Telehealth: Payer: Self-pay

## 2023-11-01 ENCOUNTER — Other Ambulatory Visit: Payer: Self-pay | Admitting: Internal Medicine

## 2023-11-02 ENCOUNTER — Other Ambulatory Visit: Payer: Self-pay | Admitting: Internal Medicine

## 2023-11-02 DIAGNOSIS — Z3041 Encounter for surveillance of contraceptive pills: Secondary | ICD-10-CM

## 2023-11-02 NOTE — Telephone Encounter (Signed)
 Refilled: 12/01/2022 Last OV: 02/22/2022 Next OV: not scheduled Last CMP: 11/2021

## 2023-11-27 ENCOUNTER — Other Ambulatory Visit: Payer: Self-pay | Admitting: Internal Medicine

## 2023-12-30 ENCOUNTER — Encounter: Payer: Self-pay | Admitting: Internal Medicine

## 2023-12-30 ENCOUNTER — Ambulatory Visit (INDEPENDENT_AMBULATORY_CARE_PROVIDER_SITE_OTHER): Payer: Self-pay | Admitting: Internal Medicine

## 2023-12-30 VITALS — BP 122/70 | HR 106 | Ht 65.0 in | Wt 183.8 lb

## 2023-12-30 DIAGNOSIS — Z23 Encounter for immunization: Secondary | ICD-10-CM

## 2023-12-30 DIAGNOSIS — Z113 Encounter for screening for infections with a predominantly sexual mode of transmission: Secondary | ICD-10-CM

## 2023-12-30 DIAGNOSIS — E663 Overweight: Secondary | ICD-10-CM

## 2023-12-30 DIAGNOSIS — Z0000 Encounter for general adult medical examination without abnormal findings: Secondary | ICD-10-CM

## 2023-12-30 DIAGNOSIS — R7301 Impaired fasting glucose: Secondary | ICD-10-CM

## 2023-12-30 DIAGNOSIS — N926 Irregular menstruation, unspecified: Secondary | ICD-10-CM

## 2023-12-30 DIAGNOSIS — R87612 Low grade squamous intraepithelial lesion on cytologic smear of cervix (LGSIL): Secondary | ICD-10-CM

## 2023-12-30 DIAGNOSIS — Z79899 Other long term (current) drug therapy: Secondary | ICD-10-CM

## 2023-12-30 MED ORDER — ELINEST 0.3-30 MG-MCG PO TABS: 1.0000 | ORAL_TABLET | Freq: Every day | ORAL | 0 refills | Status: DC

## 2023-12-30 NOTE — Patient Instructions (Addendum)
 Good to see you!   I have refilled your birth control for 30 days as requested.  Let me know if you want to continue it

## 2023-12-30 NOTE — Progress Notes (Unsigned)
 Patient ID: Lisa Mitchell, female    DOB: [birth year]  Age: 23 y.o. MRN: [id number]  The patient is here for annual preventive examination and management of other chronic and acute problems.   The risk factors are reflected in the social history.   The roster of all physicians providing medical care to patient - is listed in the Snapshot section of the chart.   Activities of daily living:  The patient is 100% independent in all ADLs: dressing, toileting, feeding as well as independent mobility   Home safety : The patient has smoke detectors in the home. They wear seatbelts.  There are no unsecured firearms at home. There is no violence in the home.    There is no risks for hepatitis, STDs or HIV. There is no   history of blood transfusion. They have no travel history to infectious disease endemic areas of the world.   The patient has seen their dentist in the last six month. They have seen their eye doctor in the last year. The patinet  denies slight hearing difficulty with regard to whispered voices and some television programs.  They have deferred audiologic testing in the last year.  They do not  have excessive sun exposure. Discussed the need for sun protection: hats, long sleeves and use of sunscreen if there is significant sun exposure.    Diet: the importance of a healthy diet is discussed. They do have a healthy diet.   The benefits of regular aerobic exercise were discussed. The patient  exercises  3 to 5 days per week  for  60 minutes.    Depression screen: there are no signs or vegative symptoms of depression- irritability, change in appetite, anhedonia, sadness/tearfullness.   The following portions of the patient's history were reviewed and updated as appropriate: allergies, current medications, past family history, past medical history,  past surgical history, past social history  and problem list.   Visual acuity was not assessed per patient preference since the patient has  regular follow up with an  ophthalmologist. Hearing and body mass index were assessed and reviewed.    During the course of the visit the patient was educated and counseled about appropriate screening and preventive services including : fall prevention , diabetes screening, nutrition counseling, colorectal cancer screening, and recommended immunizations.    Chief Complaint:    Has been off of ocp's  for 2 weeks. Not sexually active. Currently menstruating.   Review of Symptoms  Patient denies headache, fevers, malaise, unintentional weight loss, skin rash, eye pain, sinus congestion and sinus pain, sore throat, dysphagia,  hemoptysis , cough, dyspnea, wheezing, chest pain, palpitations, orthopnea, edema, abdominal pain, nausea, melena, diarrhea, constipation, flank pain, dysuria, hematuria, urinary  Frequency, nocturia, numbness, tingling, seizures,  Focal weakness, Loss of consciousness,  Tremor, insomnia, depression, anxiety, and suicidal ideation.    Physical Exam:  BP 122/70   Pulse (!) 106   Ht 5' 5 (1.651 m)   Wt 183 lb 12.8 oz (83.4 kg)   SpO2 99%   BMI 30.59 kg/m    Physical Exam Vitals reviewed.  Constitutional:      General: She is not in acute distress.    Appearance: Normal appearance. She is well-developed and normal weight. She is not ill-appearing, toxic-appearing or diaphoretic.  HENT:     Head: Normocephalic.     Right Ear: Tympanic membrane, ear canal and external ear normal. There is no impacted cerumen.     Left Ear: Tympanic  membrane, ear canal and external ear normal. There is no impacted cerumen.     Nose: Nose normal.     Mouth/Throat:     Mouth: Mucous membranes are moist.     Pharynx: Oropharynx is clear.  Eyes:     General: No scleral icterus.       Right eye: No discharge.        Left eye: No discharge.     Conjunctiva/sclera: Conjunctivae normal.     Pupils: Pupils are equal, round, and reactive to light.  Neck:     Thyroid : No thyromegaly.      Vascular: No carotid bruit or JVD.  Cardiovascular:     Rate and Rhythm: Normal rate and regular rhythm.     Heart sounds: Normal heart sounds.  Pulmonary:     Effort: Pulmonary effort is normal. No respiratory distress.     Breath sounds: Normal breath sounds.  Chest:  Breasts:    Breasts are symmetrical.     Right: Normal. No swelling, inverted nipple, mass, nipple discharge, skin change or tenderness.     Left: Normal. No swelling, inverted nipple, mass, nipple discharge, skin change or tenderness.  Abdominal:     General: Bowel sounds are normal.     Palpations: Abdomen is soft. There is no mass.     Tenderness: There is no abdominal tenderness. There is no guarding or rebound.  Musculoskeletal:        General: Normal range of motion.     Cervical back: Normal range of motion and neck supple.  Lymphadenopathy:     Cervical: No cervical adenopathy.     Upper Body:     Right upper body: No supraclavicular, axillary or pectoral adenopathy.     Left upper body: No supraclavicular, axillary or pectoral adenopathy.  Skin:    General: Skin is warm and dry.  Neurological:     General: No focal deficit present.     Mental Status: She is alert and oriented to person, place, and time. Mental status is at baseline.  Psychiatric:        Mood and Affect: Mood normal.        Behavior: Behavior normal.        Thought Content: Thought content normal.        Judgment: Judgment normal.     Assessment and Plan: Visit for preventive health examination Assessment & Plan: age appropriate education and counseling updated, referrals for preventative services and immunizations addressed, dietary and smoking counseling addressed, most recent labs reviewed.  I have personally reviewed and have noted:   1) the patient's medical and social history 2) The pt's use of alcohol, tobacco, and illicit drugs 3) The patient's current medications and supplements 4) Functional ability including ADL's, fall  risk, home safety risk, hearing and visual impairment 5) Diet and physical activities 6) Evidence for depression or mood disorder 7) The patient's height, weight, and BMI have been recorded in the chart   I have made referrals, and provided counseling and education based on review of the above    Overweight (BMI 25.0-29.9) Assessment & Plan: Screening for thyroid  disease and diabetes was negative by today's labs.  Counselling given   Lab Results  Component Value Date   TSH 0.42 12/30/2023   Lab Results  Component Value Date   HGBA1C 5.1 12/30/2023     Orders: -     TSH  Screening examination for STD (sexually transmitted disease) -  HIV Antibody (routine testing w rflx) -     GC/Chlamydia Probe Amp  Long-term use of high-risk medication -     Comprehensive metabolic panel with GFR -     CBC with Differential/Platelet  Impaired fasting glucose -     Hemoglobin A1c  Need for Tdap vaccination -     Tdap vaccine greater than or equal to 7yo IM  Irregular menstrual cycle Assessment & Plan: Chronic problem, better controlled with OCPS than with Depo Prover.  she  Will resume oral contraception and continue cranberry tablets.    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  She was given the option of 6 month follow up with me vs referral to GYN and did neither.  PAP smear has not been repeated due to patient  being lost to follow up. And was not done today due to menstruation .  Will reschedule next month    Other orders -     Elinest ; Take 1 tablet by mouth daily.  Dispense: 28 tablet; Refill: 0    No follow-ups on file.  Verneita LITTIE Kettering, MD

## 2023-12-31 LAB — CBC WITH DIFFERENTIAL/PLATELET
Absolute Lymphocytes: 2956 {cells}/uL (ref 850–3900)
Absolute Monocytes: 793 {cells}/uL (ref 200–950)
Basophils Absolute: 52 {cells}/uL (ref 0–200)
Basophils Relative: 0.5 %
Eosinophils Absolute: 82 {cells}/uL (ref 15–500)
Eosinophils Relative: 0.8 %
HCT: 39 % (ref 35.0–45.0)
Hemoglobin: 13 g/dL (ref 11.7–15.5)
MCH: 31.1 pg (ref 27.0–33.0)
MCHC: 33.3 g/dL (ref 32.0–36.0)
MCV: 93.3 fL (ref 80.0–100.0)
MPV: 10 fL (ref 7.5–12.5)
Monocytes Relative: 7.7 %
Neutro Abs: 6417 {cells}/uL (ref 1500–7800)
Neutrophils Relative %: 62.3 %
Platelets: 257 Thousand/uL (ref 140–400)
RBC: 4.18 Million/uL (ref 3.80–5.10)
RDW: 12.1 % (ref 11.0–15.0)
Total Lymphocyte: 28.7 %
WBC: 10.3 Thousand/uL (ref 3.8–10.8)

## 2023-12-31 LAB — COMPREHENSIVE METABOLIC PANEL WITH GFR
AG Ratio: 1.7 (calc) (ref 1.0–2.5)
ALT: 12 U/L (ref 6–29)
AST: 15 U/L (ref 10–30)
Albumin: 4.4 g/dL (ref 3.6–5.1)
Alkaline phosphatase (APISO): 48 U/L (ref 31–125)
BUN/Creatinine Ratio: 10 (calc) (ref 6–22)
BUN: 10 mg/dL (ref 7–25)
CO2: 24 mmol/L (ref 20–32)
Calcium: 9.1 mg/dL (ref 8.6–10.2)
Chloride: 105 mmol/L (ref 98–110)
Creat: 1.04 mg/dL — ABNORMAL HIGH (ref 0.50–0.96)
Globulin: 2.6 g/dL (ref 1.9–3.7)
Glucose, Bld: 83 mg/dL (ref 65–99)
Potassium: 4.1 mmol/L (ref 3.5–5.3)
Sodium: 138 mmol/L (ref 135–146)
Total Bilirubin: 0.4 mg/dL (ref 0.2–1.2)
Total Protein: 7 g/dL (ref 6.1–8.1)
eGFR: 76 mL/min/1.73m2 (ref >=60)

## 2023-12-31 LAB — HIV ANTIBODY (ROUTINE TESTING W REFLEX): HIV 1&2 Ab, 4th Generation: NONREACTIVE

## 2023-12-31 LAB — TSH: TSH: 0.42 m[IU]/L

## 2023-12-31 LAB — HEMOGLOBIN A1C
Hgb A1c MFr Bld: 5.1 % (ref <5.7)
Mean Plasma Glucose: 100 mg/dL
eAG (mmol/L): 5.5 mmol/L

## 2024-01-02 ENCOUNTER — Ambulatory Visit: Payer: Self-pay | Admitting: Internal Medicine

## 2024-01-02 LAB — GC/CHLAMYDIA PROBE AMP
Chlamydia trachomatis, NAA: NEGATIVE
Neisseria Gonorrhoeae by PCR: NEGATIVE

## 2024-01-02 NOTE — Assessment & Plan Note (Signed)
 Screening for thyroid  disease and diabetes was negative by today's labs.  Counselling given   Lab Results  Component Value Date   TSH 0.42 12/30/2023   Lab Results  Component Value Date   HGBA1C 5.1 12/30/2023

## 2024-01-02 NOTE — Assessment & Plan Note (Signed)
 Chronic problem, better controlled with OCPS than with Depo Prover.  she  Will resume oral contraception and continue cranberry tablets.

## 2024-01-02 NOTE — Assessment & Plan Note (Signed)

## 2024-01-02 NOTE — Assessment & Plan Note (Addendum)
 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  She was given the option of 6 month follow up with me vs referral to GYN and did neither.  PAP smear has not been repeated due to patient  being lost to follow up. And was not done today due to menstruation .  Will reschedule next month

## 2024-01-23 ENCOUNTER — Other Ambulatory Visit: Payer: Self-pay | Admitting: Internal Medicine

## 2024-02-20 ENCOUNTER — Ambulatory Visit: Payer: Self-pay | Admitting: Internal Medicine

## 2024-02-22 ENCOUNTER — Other Ambulatory Visit: Payer: Self-pay | Admitting: Internal Medicine

## 2024-04-20 ENCOUNTER — Ambulatory Visit: Payer: Self-pay | Admitting: Internal Medicine

## 2024-04-24 ENCOUNTER — Other Ambulatory Visit: Payer: Self-pay | Admitting: Internal Medicine

## 2024-06-08 ENCOUNTER — Ambulatory Visit: Payer: Self-pay | Admitting: Internal Medicine

## 2024-06-12 ENCOUNTER — Ambulatory Visit: Payer: Self-pay | Admitting: Internal Medicine

## 2024-06-15 ENCOUNTER — Encounter: Payer: Self-pay | Admitting: Internal Medicine

## 2024-06-15 ENCOUNTER — Telehealth: Payer: Self-pay | Admitting: Internal Medicine

## 2024-06-15 VITALS — Ht 66.0 in | Wt 185.0 lb

## 2024-06-15 DIAGNOSIS — F17200 Nicotine dependence, unspecified, uncomplicated: Secondary | ICD-10-CM

## 2024-06-15 DIAGNOSIS — R4589 Other symptoms and signs involving emotional state: Secondary | ICD-10-CM

## 2024-06-15 DIAGNOSIS — R41840 Attention and concentration deficit: Secondary | ICD-10-CM

## 2024-06-15 MED ORDER — BUPROPION HCL ER (XL) 150 MG PO TB24: 150.0000 mg | ORAL_TABLET | Freq: Every day | ORAL | 2 refills | Status: AC

## 2024-06-15 MED ORDER — PAROXETINE HCL 10 MG PO TABS: 10.0000 mg | ORAL_TABLET | Freq: Every day | ORAL | 0 refills | Status: AC

## 2024-06-15 NOTE — Progress Notes (Unsigned)
 Virtual Visit via Caregility   Note   This format is felt to be most appropriate for this patient at this time.  All issues noted in this document were discussed and addressed.  No physical exam was performed (except for noted visual exam findings with Video Visits).   I connected with Lisa Mitchell on 06/15/24 at  5:00 PM EST by a video enabled telemedicine application  and verified that I am speaking with the correct person using two identifiers. Location patient: home Location provider: work or home office Persons participating in the virtual visit: patient, provider  I discussed the limitations, risks, security and privacy concerns of performing an evaluation and management service by telephone and the availability of in person appointments. I also discussed with the patient that there may be a patient responsible charge related to this service. The patient expressed understanding and agreed to proceed.  Reason for visit: uncontrolled anxiety,  vaping   HPI: 24 yr old female presents with uncontrolled anxiety for the past several months,  started after a viral illness several weeks ago which included nausea and vomiting.  Has been feeling very anxious since then,  several emotional breakdowns worried abut her health. . Has worried about her health for years..  no major illness.  Has started exercising 3 days per week at a gym nad doing pilates at home  feels better for several hours afterward  also reading her bible twice daily   Vaping for the pas 3 years.  No history of cigarette use.   Working and taking classes online at night  trouble concentrating    ROS: See pertinent positives and negatives per HPI.  Past Medical History:  Diagnosis Date   Anxiety    Asthma    Headache(784.0)     History reviewed. No pertinent surgical history.  History reviewed. No pertinent family history.  SOCIAL HX: ***  Current Medications[1]  EXAM:  VITALS per patient if applicable:  GENERAL:  alert, oriented, appears well and in no acute distress  HEENT: atraumatic, conjunttiva clear, no obvious abnormalities on inspection of external nose and ears  NECK: normal movements of the head and neck  LUNGS: on inspection no signs of respiratory distress, breathing rate appears normal, no obvious gross SOB, gasping or wheezing  CV: no obvious cyanosis  MS: moves all visible extremities without noticeable abnormality  PSYCH/NEURO: pleasant and cooperative, no obvious depression or anxiety, speech and thought processing grossly intact  ASSESSMENT AND PLAN: There are no diagnoses linked to this encounter.    I discussed the assessment and treatment plan with the patient. The patient was provided an opportunity to ask questions and all were answered. The patient agreed with the plan and demonstrated an understanding of the instructions.   The patient was advised to call back or seek an in-person evaluation if the symptoms worsen or if the condition fails to improve as anticipated.   I spent 30 minutes dedicated to the care of this patient on the date of this encounter to include pre-visit review of patient's medical history,  including recent ER visit, imaging studies and labs, face-to-face time with the patient , and post visit ordering of testing and therapeutics.    Lisa LITTIE Kettering, MD      [1]  Current Outpatient Medications:    norgestrel -ethinyl estradiol  (ELINEST ) 0.3-30 MG-MCG tablet, Take 1 tablet by mouth once daily, Disp: 84 tablet, Rfl: 3

## 2024-06-15 NOTE — Patient Instructions (Addendum)
" °  I'm sorry you are having such a rough time,   But I believe there are SAFE, NONADDICTING medications we can use to manage your anxiety,  reduce your urge to VAPE,  and improve your concentration.  .   Please start the Paxil  (paroxetine )   at 1/2 tablet daily in the morning after you have eaten breakfast,  ;  this is to avoid developing the most common side effect,  Which is nausea.  You can increase to a full tablet after 4 days if you are tolerating the medication .  This is still a low dose so we can increase the dose to 20 mg down the road (which is why I gave your #90)  After 2 weeks ,  add the wellbutrin  .  Take it in the morning as well.  It is stimulating and helps with cravings   I am making a referral for ADD testing in [place].    The online devotional I mentioned is available morning and evening on Youtube from a ministry out of [place] called [employer] "

## 2024-06-16 DIAGNOSIS — F17200 Nicotine dependence, unspecified, uncomplicated: Secondary | ICD-10-CM | POA: Insufficient documentation

## 2024-06-16 DIAGNOSIS — R41840 Attention and concentration deficit: Secondary | ICD-10-CM | POA: Insufficient documentation

## 2024-06-16 NOTE — Assessment & Plan Note (Signed)
 Trial of Paxil  recommended.

## 2024-06-16 NOTE — Assessment & Plan Note (Signed)
 Her vaping habits are aggravated by her anxiety.  Will treat SSRI and add wellbutrin  . Follow up one month

## 2024-06-16 NOTE — Assessment & Plan Note (Signed)
 She has no formal diagnosis ; symptoms may be aggravated by anxiety or contributing to her anxiety.  Starting paxil  wnd wellbutrin  with a two week delay on the wellbutrin
